# Patient Record
Sex: Male | Born: 1965
Health system: Southern US, Community
[De-identification: ages and names within clinical notes are randomized; demographics above are authoritative.]

## PROBLEM LIST (undated history)

## (undated) DIAGNOSIS — E785 Hyperlipidemia, unspecified: Secondary | ICD-10-CM

## (undated) DIAGNOSIS — I10 Essential (primary) hypertension: Secondary | ICD-10-CM

## (undated) DIAGNOSIS — M47812 Spondylosis without myelopathy or radiculopathy, cervical region: Secondary | ICD-10-CM

## (undated) HISTORY — PX: COLONOSCOPY: SHX174

## (undated) HISTORY — PX: KNEE ARTHROSCOPY: SUR90

---

## 1998-12-17 ENCOUNTER — Encounter: Payer: Self-pay | Admitting: Emergency Medicine

## 1998-12-17 ENCOUNTER — Emergency Department (HOSPITAL_COMMUNITY): Admission: EM | Admit: 1998-12-17 | Discharge: 1998-12-17 | Payer: Self-pay | Admitting: Emergency Medicine

## 2001-11-28 ENCOUNTER — Emergency Department (HOSPITAL_COMMUNITY): Admission: EM | Admit: 2001-11-28 | Discharge: 2001-11-28 | Payer: Self-pay | Admitting: Emergency Medicine

## 2002-06-02 ENCOUNTER — Encounter: Payer: Self-pay | Admitting: Family Medicine

## 2002-06-02 ENCOUNTER — Ambulatory Visit (HOSPITAL_COMMUNITY): Admission: RE | Admit: 2002-06-02 | Discharge: 2002-06-02 | Payer: Self-pay | Admitting: Family Medicine

## 2002-06-12 ENCOUNTER — Emergency Department (HOSPITAL_COMMUNITY): Admission: EM | Admit: 2002-06-12 | Discharge: 2002-06-12 | Payer: Self-pay | Admitting: Emergency Medicine

## 2002-06-12 ENCOUNTER — Encounter: Payer: Self-pay | Admitting: Emergency Medicine

## 2002-06-13 ENCOUNTER — Encounter: Admission: RE | Admit: 2002-06-13 | Discharge: 2002-07-17 | Payer: Self-pay | Admitting: Family Medicine

## 2002-08-24 ENCOUNTER — Encounter: Payer: Self-pay | Admitting: Emergency Medicine

## 2002-08-25 ENCOUNTER — Inpatient Hospital Stay (HOSPITAL_COMMUNITY): Admission: EM | Admit: 2002-08-25 | Discharge: 2002-08-26 | Payer: Self-pay | Admitting: Emergency Medicine

## 2002-08-25 ENCOUNTER — Encounter: Payer: Self-pay | Admitting: Family Medicine

## 2002-08-25 ENCOUNTER — Encounter: Payer: Self-pay | Admitting: Cardiology

## 2002-08-26 ENCOUNTER — Encounter: Payer: Self-pay | Admitting: Family Medicine

## 2002-08-28 ENCOUNTER — Encounter: Admission: RE | Admit: 2002-08-28 | Discharge: 2002-08-28 | Payer: Self-pay | Admitting: Family Medicine

## 2003-04-30 ENCOUNTER — Ambulatory Visit (HOSPITAL_COMMUNITY): Admission: RE | Admit: 2003-04-30 | Discharge: 2003-04-30 | Payer: Self-pay | Admitting: Family Medicine

## 2003-06-11 ENCOUNTER — Emergency Department (HOSPITAL_COMMUNITY): Admission: EM | Admit: 2003-06-11 | Discharge: 2003-06-11 | Payer: Self-pay | Admitting: Emergency Medicine

## 2004-01-21 ENCOUNTER — Ambulatory Visit: Payer: Self-pay | Admitting: Family Medicine

## 2004-03-20 ENCOUNTER — Ambulatory Visit: Payer: Self-pay | Admitting: Family Medicine

## 2004-03-27 ENCOUNTER — Ambulatory Visit (HOSPITAL_COMMUNITY): Admission: RE | Admit: 2004-03-27 | Discharge: 2004-03-27 | Payer: Self-pay | Admitting: Family Medicine

## 2004-04-07 ENCOUNTER — Ambulatory Visit: Payer: Self-pay | Admitting: Family Medicine

## 2004-04-11 ENCOUNTER — Ambulatory Visit: Payer: Self-pay | Admitting: Family Medicine

## 2004-04-11 ENCOUNTER — Ambulatory Visit: Payer: Self-pay | Admitting: *Deleted

## 2004-04-21 ENCOUNTER — Ambulatory Visit: Payer: Self-pay | Admitting: Family Medicine

## 2004-06-06 ENCOUNTER — Ambulatory Visit: Payer: Self-pay | Admitting: Family Medicine

## 2004-08-08 ENCOUNTER — Ambulatory Visit: Payer: Self-pay | Admitting: Family Medicine

## 2004-10-28 ENCOUNTER — Ambulatory Visit: Payer: Self-pay | Admitting: Family Medicine

## 2004-10-31 ENCOUNTER — Ambulatory Visit: Payer: Self-pay | Admitting: Family Medicine

## 2004-11-04 ENCOUNTER — Ambulatory Visit: Payer: Self-pay | Admitting: Family Medicine

## 2004-12-22 ENCOUNTER — Ambulatory Visit: Payer: Self-pay | Admitting: Family Medicine

## 2005-01-06 ENCOUNTER — Ambulatory Visit: Payer: Self-pay | Admitting: Family Medicine

## 2005-01-26 ENCOUNTER — Ambulatory Visit: Payer: Self-pay | Admitting: Family Medicine

## 2005-03-10 ENCOUNTER — Ambulatory Visit: Payer: Self-pay | Admitting: Family Medicine

## 2005-03-10 ENCOUNTER — Ambulatory Visit (HOSPITAL_COMMUNITY): Admission: RE | Admit: 2005-03-10 | Discharge: 2005-03-10 | Payer: Self-pay | Admitting: Family Medicine

## 2005-03-27 ENCOUNTER — Ambulatory Visit: Payer: Self-pay | Admitting: Nurse Practitioner

## 2005-04-06 ENCOUNTER — Ambulatory Visit: Payer: Self-pay | Admitting: Family Medicine

## 2005-05-12 ENCOUNTER — Ambulatory Visit: Payer: Self-pay | Admitting: Family Medicine

## 2005-05-19 ENCOUNTER — Ambulatory Visit: Payer: Self-pay | Admitting: Family Medicine

## 2005-06-01 ENCOUNTER — Ambulatory Visit: Payer: Self-pay | Admitting: Family Medicine

## 2005-06-10 ENCOUNTER — Ambulatory Visit: Payer: Self-pay | Admitting: Family Medicine

## 2005-07-03 ENCOUNTER — Ambulatory Visit: Payer: Self-pay | Admitting: Family Medicine

## 2005-07-06 ENCOUNTER — Ambulatory Visit (HOSPITAL_COMMUNITY): Admission: RE | Admit: 2005-07-06 | Discharge: 2005-07-06 | Payer: Self-pay | Admitting: Family Medicine

## 2005-07-10 ENCOUNTER — Ambulatory Visit: Payer: Self-pay | Admitting: Family Medicine

## 2005-08-06 ENCOUNTER — Ambulatory Visit: Payer: Self-pay | Admitting: Family Medicine

## 2005-10-28 ENCOUNTER — Ambulatory Visit: Payer: Self-pay | Admitting: Family Medicine

## 2006-01-18 IMAGING — CR DG CERVICAL SPINE COMPLETE 4+V
6 series · 6 of 6 positions shown · non-contrast
Comparison: none

CLINICAL DATA: Neck and left shoulder pain. 
 LEFT SHOULDER (THREE VIEW)
 There is no evidence of fracture or dislocation.  No other significant bone or soft tissue abnormalities are identified. 
 IMPRESSION
 Normal study. 
 CERVICAL SPINE (FIVE VIEW)
 There is mild narrowing C5-6 interspace with adjacent end-plate spurs.  There is mild reversal of the normal lordosis of the cervical spine.  Negative for fracture or other acute abnormality. 
 Degenerative disk disease C5-6.  
 Mild reversal of the normal lordosis, which may be due to spasm, soft tissue injury or positioning.

[view not recorded (1 of 6)]
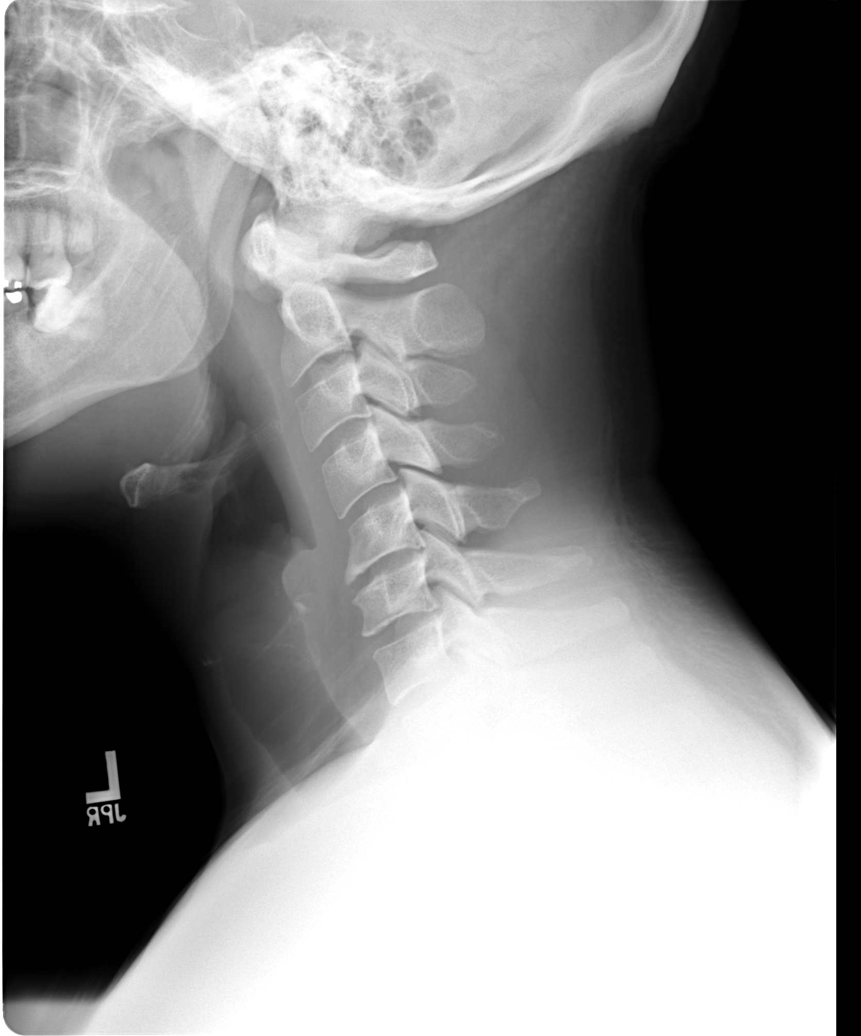

[view not recorded (2 of 6)]
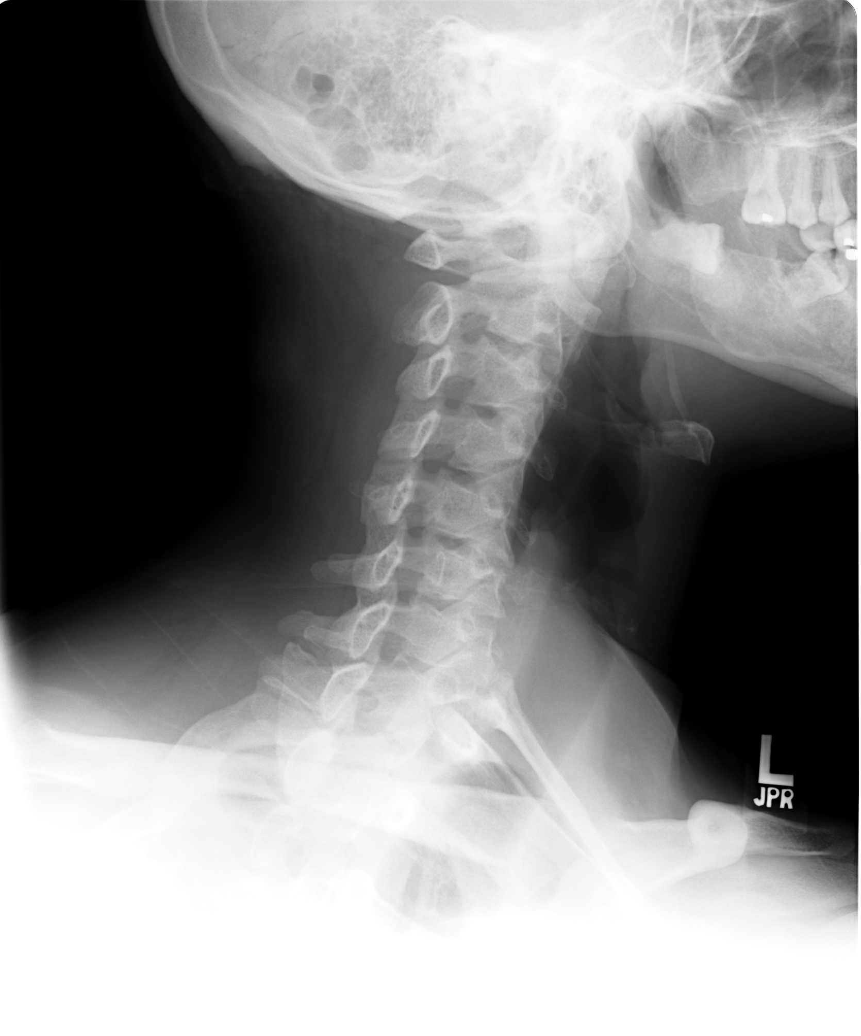

[view not recorded (3 of 6)]
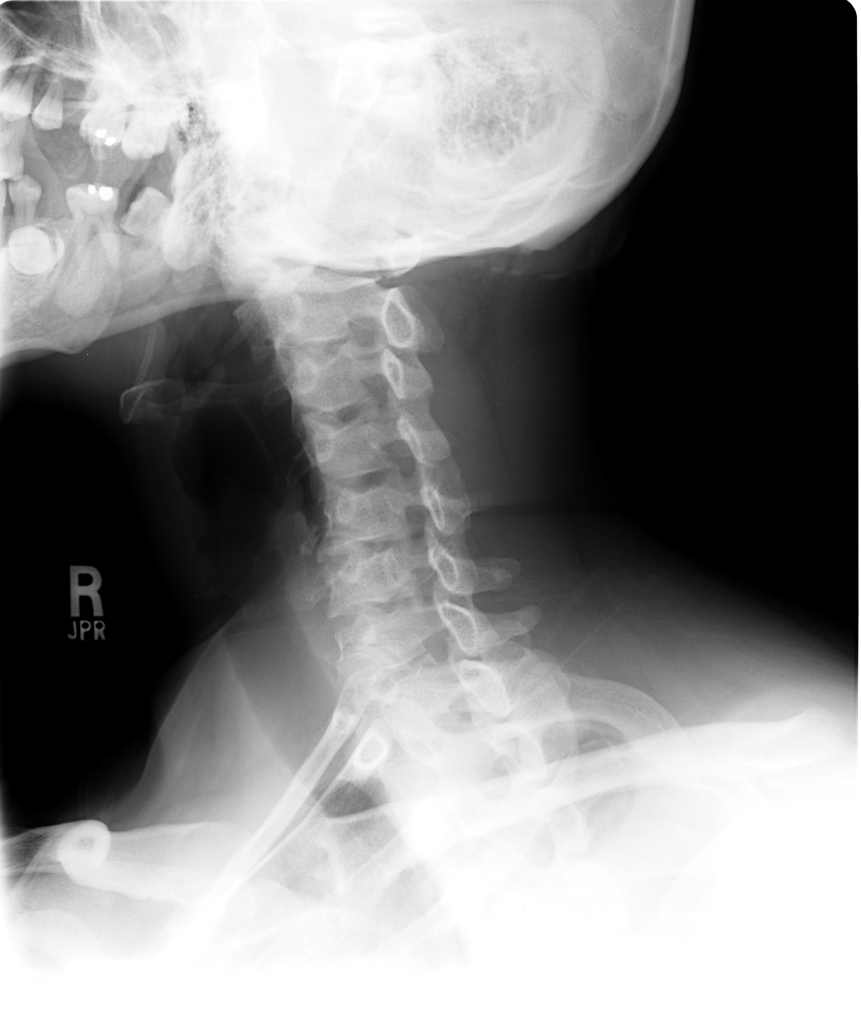

[view not recorded (4 of 6)]
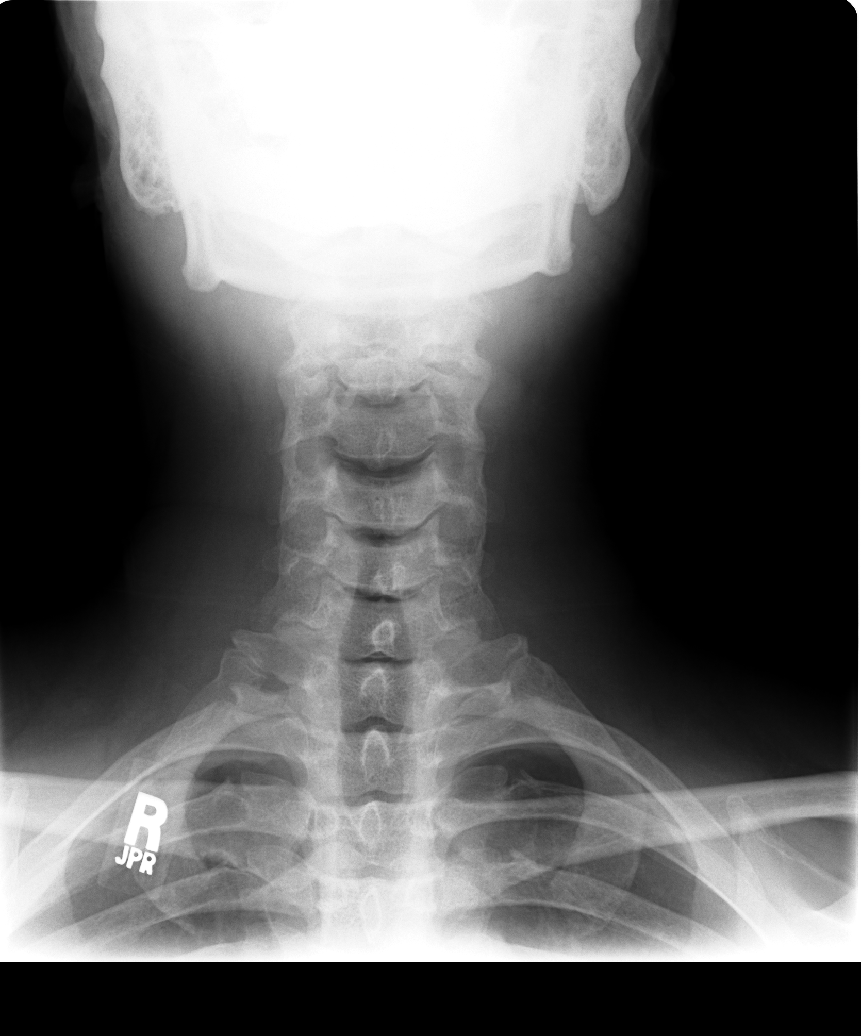

[view not recorded (5 of 6)]
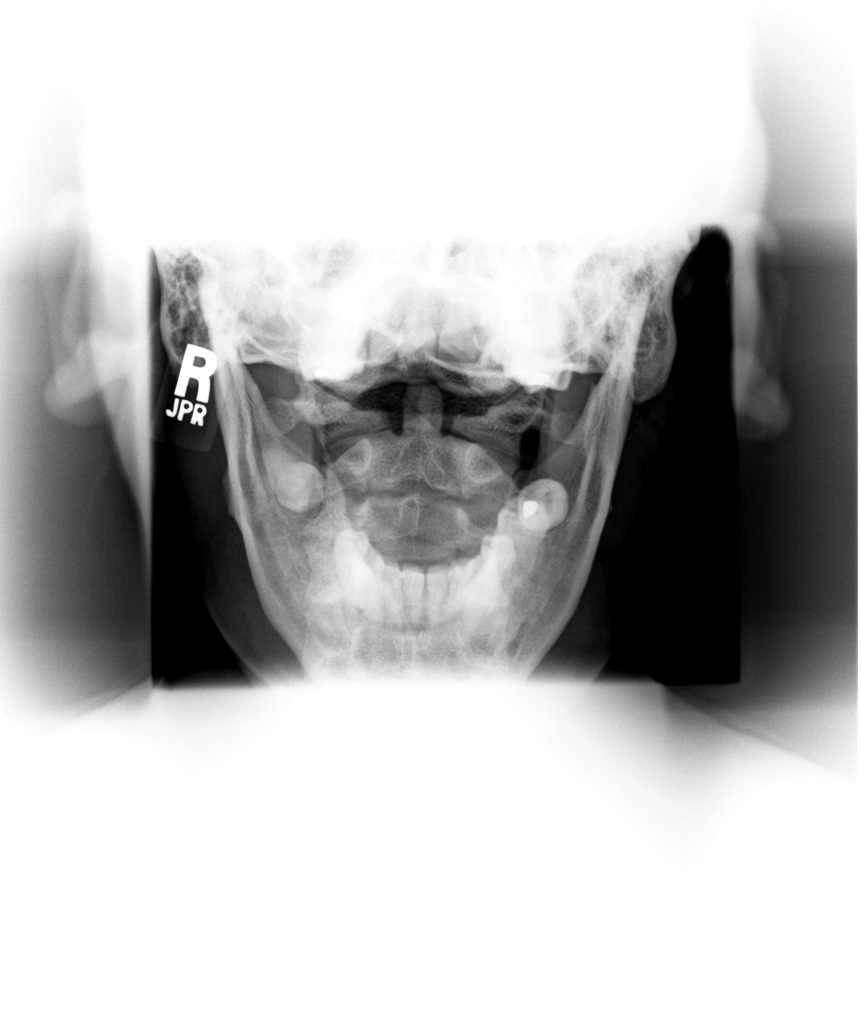

[view not recorded (6 of 6)]
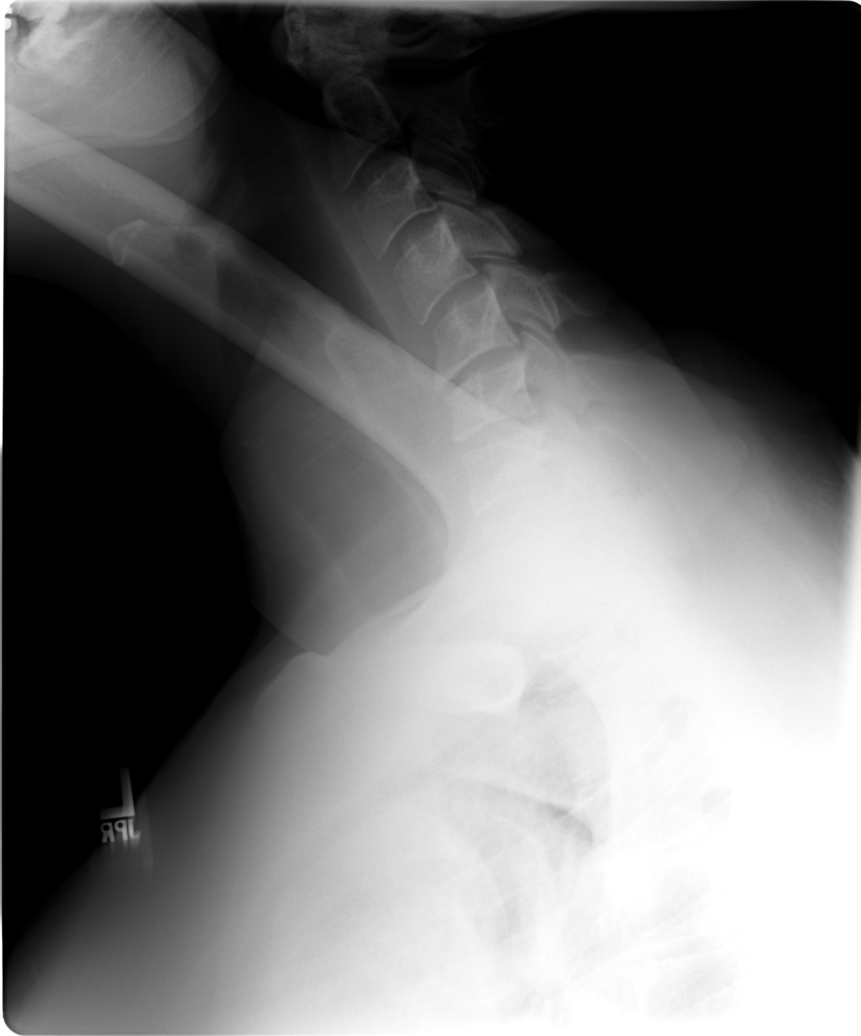

[6 of 6 positions shown; findings below may reference images not displayed]

## 2006-06-10 ENCOUNTER — Ambulatory Visit: Payer: Self-pay | Admitting: Family Medicine

## 2006-08-13 DIAGNOSIS — I10 Essential (primary) hypertension: Secondary | ICD-10-CM

## 2006-08-13 DIAGNOSIS — M5030 Other cervical disc degeneration, unspecified cervical region: Secondary | ICD-10-CM | POA: Insufficient documentation

## 2006-08-13 DIAGNOSIS — E785 Hyperlipidemia, unspecified: Secondary | ICD-10-CM

## 2006-08-13 DIAGNOSIS — K219 Gastro-esophageal reflux disease without esophagitis: Secondary | ICD-10-CM

## 2006-11-24 ENCOUNTER — Encounter (INDEPENDENT_AMBULATORY_CARE_PROVIDER_SITE_OTHER): Payer: Self-pay | Admitting: *Deleted

## 2007-03-21 ENCOUNTER — Ambulatory Visit: Payer: Self-pay | Admitting: Family Medicine

## 2007-04-05 ENCOUNTER — Ambulatory Visit: Payer: Self-pay | Admitting: Family Medicine

## 2007-04-05 LAB — CONVERTED CEMR LAB
ALT: 62 units/L — ABNORMAL HIGH (ref 0–53)
AST: 40 units/L — ABNORMAL HIGH (ref 0–37)
Albumin: 5 g/dL (ref 3.5–5.2)
Alkaline Phosphatase: 81 units/L (ref 39–117)
BUN: 15 mg/dL (ref 6–23)
CO2: 22 meq/L (ref 19–32)
Calcium: 10.3 mg/dL (ref 8.4–10.5)
Chloride: 103 meq/L (ref 96–112)
Cholesterol: 217 mg/dL — ABNORMAL HIGH (ref 0–200)
Creatinine, Ser: 1 mg/dL (ref 0.40–1.50)
Glucose, Bld: 106 mg/dL — ABNORMAL HIGH (ref 70–99)
HDL: 40 mg/dL (ref >39)
LDL Cholesterol: 138 mg/dL — ABNORMAL HIGH (ref 0–99)
Potassium: 3.9 meq/L (ref 3.5–5.3)
Sodium: 142 meq/L (ref 135–145)
Total Bilirubin: 0.4 mg/dL (ref 0.3–1.2)
Total CHOL/HDL Ratio: 5.4
Total Protein: 8.1 g/dL (ref 6.0–8.3)
Triglycerides: 195 mg/dL — ABNORMAL HIGH (ref <150)
VLDL: 39 mg/dL (ref 0–40)

## 2007-09-07 ENCOUNTER — Emergency Department (HOSPITAL_COMMUNITY): Admission: EM | Admit: 2007-09-07 | Discharge: 2007-09-07 | Payer: Self-pay | Admitting: Emergency Medicine

## 2007-10-06 ENCOUNTER — Ambulatory Visit: Payer: Self-pay | Admitting: Family Medicine

## 2007-10-25 ENCOUNTER — Encounter (INDEPENDENT_AMBULATORY_CARE_PROVIDER_SITE_OTHER): Payer: Self-pay | Admitting: Family Medicine

## 2007-10-25 ENCOUNTER — Ambulatory Visit: Payer: Self-pay | Admitting: Internal Medicine

## 2007-10-25 LAB — CONVERTED CEMR LAB
ALT: 45 units/L (ref 0–53)
CO2: 23 meq/L (ref 19–32)
Calcium: 9.4 mg/dL (ref 8.4–10.5)
Chloride: 103 meq/L (ref 96–112)
Cholesterol: 245 mg/dL — ABNORMAL HIGH (ref 0–200)
PSA: 0.83 ng/mL (ref 0.10–4.00)
Sodium: 141 meq/L (ref 135–145)
Total Bilirubin: 0.4 mg/dL (ref 0.3–1.2)
Total Protein: 7.5 g/dL (ref 6.0–8.3)
VLDL: 23 mg/dL (ref 0–40)

## 2007-11-03 ENCOUNTER — Ambulatory Visit: Payer: Self-pay | Admitting: Family Medicine

## 2007-11-21 ENCOUNTER — Ambulatory Visit: Payer: Self-pay | Admitting: Internal Medicine

## 2008-03-16 ENCOUNTER — Ambulatory Visit: Payer: Self-pay | Admitting: Family Medicine

## 2008-05-18 ENCOUNTER — Ambulatory Visit: Payer: Self-pay | Admitting: Family Medicine

## 2008-05-18 LAB — CONVERTED CEMR LAB
ALT: 44 units/L (ref 0–53)
AST: 34 units/L (ref 0–37)
Albumin: 4.9 g/dL (ref 3.5–5.2)
Alkaline Phosphatase: 73 units/L (ref 39–117)
Glucose, Bld: 105 mg/dL — ABNORMAL HIGH (ref 70–99)
LDL Cholesterol: 144 mg/dL — ABNORMAL HIGH (ref 0–99)
Potassium: 3.9 meq/L (ref 3.5–5.3)
Sodium: 140 meq/L (ref 135–145)
Total Protein: 7.9 g/dL (ref 6.0–8.3)
Triglycerides: 224 mg/dL — ABNORMAL HIGH (ref <150)

## 2008-05-24 ENCOUNTER — Emergency Department (HOSPITAL_COMMUNITY): Admission: EM | Admit: 2008-05-24 | Discharge: 2008-05-24 | Payer: Self-pay | Admitting: Family Medicine

## 2008-06-06 ENCOUNTER — Ambulatory Visit: Payer: Self-pay | Admitting: Family Medicine

## 2008-06-13 ENCOUNTER — Ambulatory Visit (HOSPITAL_COMMUNITY): Admission: RE | Admit: 2008-06-13 | Discharge: 2008-06-13 | Payer: Self-pay | Admitting: Family Medicine

## 2008-07-05 ENCOUNTER — Ambulatory Visit: Payer: Self-pay | Admitting: Family Medicine

## 2008-07-05 ENCOUNTER — Encounter: Payer: Self-pay | Admitting: Internal Medicine

## 2008-07-05 DIAGNOSIS — R252 Cramp and spasm: Secondary | ICD-10-CM | POA: Insufficient documentation

## 2008-07-09 ENCOUNTER — Emergency Department (HOSPITAL_COMMUNITY): Admission: EM | Admit: 2008-07-09 | Discharge: 2008-07-09 | Payer: Self-pay | Admitting: Emergency Medicine

## 2008-08-07 ENCOUNTER — Ambulatory Visit: Payer: Self-pay | Admitting: Family Medicine

## 2008-11-13 ENCOUNTER — Ambulatory Visit: Payer: Self-pay | Admitting: Family Medicine

## 2008-11-13 LAB — CONVERTED CEMR LAB: Microalb, Ur: 0.8 mg/dL (ref 0.00–1.89)

## 2009-01-12 ENCOUNTER — Emergency Department (HOSPITAL_COMMUNITY): Admission: EM | Admit: 2009-01-12 | Discharge: 2009-01-12 | Payer: Self-pay | Admitting: Family Medicine

## 2009-02-02 ENCOUNTER — Emergency Department (HOSPITAL_BASED_OUTPATIENT_CLINIC_OR_DEPARTMENT_OTHER): Admission: EM | Admit: 2009-02-02 | Discharge: 2009-02-02 | Payer: Self-pay | Admitting: Emergency Medicine

## 2009-03-14 ENCOUNTER — Ambulatory Visit: Payer: Self-pay | Admitting: Family Medicine

## 2009-03-14 LAB — CONVERTED CEMR LAB
Chloride: 106 meq/L (ref 96–112)
Cholesterol: 243 mg/dL — ABNORMAL HIGH (ref 0–200)
HDL: 35 mg/dL — ABNORMAL LOW (ref >39)
LDL Cholesterol: 184 mg/dL — ABNORMAL HIGH (ref 0–99)
Potassium: 3.6 meq/L (ref 3.5–5.3)
Sodium: 143 meq/L (ref 135–145)
Total CHOL/HDL Ratio: 6.9
Triglycerides: 122 mg/dL (ref <150)
VLDL: 24 mg/dL (ref 0–40)

## 2009-04-29 ENCOUNTER — Emergency Department (HOSPITAL_BASED_OUTPATIENT_CLINIC_OR_DEPARTMENT_OTHER): Admission: EM | Admit: 2009-04-29 | Discharge: 2009-04-29 | Payer: Self-pay | Admitting: Emergency Medicine

## 2009-06-18 ENCOUNTER — Ambulatory Visit: Payer: Self-pay | Admitting: Family Medicine

## 2009-06-18 LAB — CONVERTED CEMR LAB
Cholesterol: 254 mg/dL — ABNORMAL HIGH (ref 0–200)
Triglycerides: 105 mg/dL (ref <150)

## 2009-07-05 ENCOUNTER — Ambulatory Visit: Payer: Self-pay | Admitting: Family Medicine

## 2009-10-03 ENCOUNTER — Ambulatory Visit: Payer: Self-pay | Admitting: Family Medicine

## 2009-10-03 LAB — CONVERTED CEMR LAB
Cholesterol: 260 mg/dL — ABNORMAL HIGH (ref 0–200)
Total CHOL/HDL Ratio: 7.2
Triglycerides: 159 mg/dL — ABNORMAL HIGH (ref <150)
VLDL: 32 mg/dL (ref 0–40)

## 2009-11-13 ENCOUNTER — Ambulatory Visit: Payer: Self-pay | Admitting: Diagnostic Radiology

## 2009-11-13 ENCOUNTER — Ambulatory Visit: Payer: Self-pay | Admitting: Cardiology

## 2009-11-13 ENCOUNTER — Encounter: Payer: Self-pay | Admitting: Emergency Medicine

## 2009-11-13 ENCOUNTER — Observation Stay (HOSPITAL_COMMUNITY): Admission: AD | Admit: 2009-11-13 | Discharge: 2009-11-14 | Payer: Self-pay | Admitting: Internal Medicine

## 2009-11-14 ENCOUNTER — Encounter (INDEPENDENT_AMBULATORY_CARE_PROVIDER_SITE_OTHER): Payer: Self-pay | Admitting: Internal Medicine

## 2010-02-06 ENCOUNTER — Encounter (INDEPENDENT_AMBULATORY_CARE_PROVIDER_SITE_OTHER): Payer: Self-pay | Admitting: Family Medicine

## 2010-02-06 LAB — CONVERTED CEMR LAB
ALT: 36 units/L (ref 0–53)
LDL Cholesterol: 185 mg/dL — ABNORMAL HIGH (ref 0–99)

## 2010-03-09 ENCOUNTER — Emergency Department (HOSPITAL_BASED_OUTPATIENT_CLINIC_OR_DEPARTMENT_OTHER)
Admission: EM | Admit: 2010-03-09 | Discharge: 2010-03-09 | Payer: Self-pay | Source: Home / Self Care | Admitting: Emergency Medicine

## 2010-03-19 ENCOUNTER — Ambulatory Visit (HOSPITAL_COMMUNITY)
Admission: RE | Admit: 2010-03-19 | Discharge: 2010-03-19 | Payer: Self-pay | Source: Home / Self Care | Attending: Family Medicine | Admitting: Family Medicine

## 2010-03-30 ENCOUNTER — Encounter: Payer: Self-pay | Admitting: Family Medicine

## 2010-04-03 ENCOUNTER — Ambulatory Visit (HOSPITAL_COMMUNITY)
Admission: RE | Admit: 2010-04-03 | Discharge: 2010-04-03 | Payer: Self-pay | Source: Home / Self Care | Attending: Family Medicine | Admitting: Family Medicine

## 2010-04-10 ENCOUNTER — Encounter (INDEPENDENT_AMBULATORY_CARE_PROVIDER_SITE_OTHER): Payer: Self-pay | Admitting: Family Medicine

## 2010-04-10 LAB — CONVERTED CEMR LAB
Chloride: 107 meq/L (ref 96–112)
HDL: 38 mg/dL — ABNORMAL LOW (ref >39)
LDL Cholesterol: 161 mg/dL — ABNORMAL HIGH (ref 0–99)
Potassium: 3.7 meq/L (ref 3.5–5.3)
Triglycerides: 188 mg/dL — ABNORMAL HIGH (ref <150)
VLDL: 38 mg/dL (ref 0–40)

## 2010-05-22 LAB — POCT CARDIAC MARKERS
CKMB, poc: 2.4 ng/mL (ref 1.0–8.0)
CKMB, poc: 2.7 ng/mL (ref 1.0–8.0)
Troponin i, poc: <0.05 ng/mL (ref 0.00–0.09)
Troponin i, poc: <0.05 ng/mL (ref 0.00–0.09)

## 2010-05-22 LAB — COMPREHENSIVE METABOLIC PANEL
AST: 40 U/L — ABNORMAL HIGH (ref 0–37)
CO2: 26 mEq/L (ref 19–32)
Calcium: 9 mg/dL (ref 8.4–10.5)
Creatinine, Ser: 1.12 mg/dL (ref 0.4–1.5)
GFR calc Af Amer: >60 mL/min (ref >60)
GFR calc non Af Amer: >60 mL/min (ref >60)
Glucose, Bld: 117 mg/dL — ABNORMAL HIGH (ref 70–99)

## 2010-05-22 LAB — CBC
HCT: 41.6 % (ref 39.0–52.0)
Hemoglobin: 13.6 g/dL (ref 13.0–17.0)
MCH: 28.7 pg (ref 26.0–34.0)
MCHC: 34 g/dL (ref 30.0–36.0)
MCV: 85.7 fL (ref 78.0–100.0)
RBC: 4.85 MIL/uL (ref 4.22–5.81)
WBC: 5.2 10*3/uL (ref 4.0–10.5)

## 2010-05-22 LAB — DIFFERENTIAL
Eosinophils Relative: 2 % (ref 0–5)
Lymphocytes Relative: 29 % (ref 12–46)
Lymphs Abs: 1.5 10*3/uL (ref 0.7–4.0)
Monocytes Absolute: 0.3 10*3/uL (ref 0.1–1.0)

## 2010-05-22 LAB — BASIC METABOLIC PANEL
Chloride: 107 mEq/L (ref 96–112)
GFR calc Af Amer: >60 mL/min (ref >60)
Potassium: 4.5 mEq/L (ref 3.5–5.1)
Sodium: 143 mEq/L (ref 135–145)

## 2010-05-22 LAB — CARDIAC PANEL(CRET KIN+CKTOT+MB+TROPI)
CK, MB: 5.5 ng/mL — ABNORMAL HIGH (ref 0.3–4.0)
CK, MB: 5.9 ng/mL — ABNORMAL HIGH (ref 0.3–4.0)

## 2010-06-17 LAB — COMPREHENSIVE METABOLIC PANEL
AST: 48 U/L — ABNORMAL HIGH (ref 0–37)
Albumin: 4.9 g/dL (ref 3.5–5.2)
BUN: 8 mg/dL (ref 6–23)
Calcium: 9.8 mg/dL (ref 8.4–10.5)
Chloride: 105 mEq/L (ref 96–112)
Creatinine, Ser: 1.14 mg/dL (ref 0.4–1.5)
GFR calc Af Amer: >60 mL/min (ref >60)
Total Bilirubin: 0.7 mg/dL (ref 0.3–1.2)
Total Protein: 7.6 g/dL (ref 6.0–8.3)

## 2010-06-17 LAB — DIFFERENTIAL
Basophils Absolute: 0 10*3/uL (ref 0.0–0.1)
Basophils Relative: 0 % (ref 0–1)
Eosinophils Relative: 1 % (ref 0–5)
Lymphocytes Relative: 19 % (ref 12–46)

## 2010-06-17 LAB — CBC
HCT: 42 % (ref 39.0–52.0)
MCHC: 34.4 g/dL (ref 30.0–36.0)
Platelets: 167 10*3/uL (ref 150–400)
RDW: 13.4 % (ref 11.5–15.5)

## 2010-06-17 LAB — TROPONIN I: Troponin I: 0.02 ng/mL (ref 0.00–0.06)

## 2010-06-17 LAB — POCT CARDIAC MARKERS
CKMB, poc: 3.6 ng/mL (ref 1.0–8.0)
Myoglobin, poc: 316 ng/mL (ref 12–200)
Troponin i, poc: <0.05 ng/mL (ref 0.00–0.09)

## 2010-07-25 NOTE — Consult Note (Signed)
NAME:  Jonathan, [name]                       ACCOUNT NO.:  [id number]   MEDICAL RECORD NO.:  [id number]                   PATIENT TYPE:  EMS   LOCATION:  ED                                   FACILITY:  [facility]   PHYSICIAN:  Olga Millers, M.D. [facility]            DATE OF BIRTH:  1966/01/16   DATE OF CONSULTATION:  06/11/2003  DATE OF DISCHARGE:                                   CONSULTATION   REASON FOR CONSULTATION:  [name] is a 45 year old male with a past  medical history of hypertension and hyperlipidemia who presents with chest  pain.  The patient has no prior cardiac history other than an admission in  June 2004, to [facility] for chest pain.  At that time, he  apparently ruled out by troponins, but his CKs were elevated.  He did have a  Cardiolite that showed an ejection fraction of 48%, and normal perfusion.  An echocardiogram showed normal LV function and no significant valvular  abnormalities.  The patient typically does not have exertional chest pain,  dyspnea on exertion, syncope, or palpitations.  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  Over the  past 1-1/2 to 2 weeks he complains to have chest pain.  It is described as a  pressure under the left breast and radiating to the left back.  It is not  exertional nor it is pleuritic.  It is not related to food.  It is not  positional.  There is no associated nausea, vomiting, shortness of breath,  diaphoresis.  The pain has been continuous for 1-1/2 to 2 weeks without ever  completely resolving.  There was some improvement in the back pain with his  wife massaging the area.  Because of his persistent symptoms he presented to  the emergency room for further evaluation, and we were asked to further  evaluate.   ALLERGIES:  No known drug allergies.   MEDICATIONS:  On no medications on a  routine basis.   SOCIAL HISTORY:  He denies any tobacco use or alcohol use.  He does  occasionally use marijuana.   FAMILY HISTORY:  Positive for coronary artery disease in his father.   PAST MEDICAL HISTORY:  1. Hypertension.  2. Hyperlipidemia.  No diabetes mellitus.   PAST SURGICAL HISTORY:  Arthroscopic surgery on his right knee, but no other  surgeries are noted.   REVIEW OF SYSTEMS:  He does occasionally have a headache.  There is no  fevers, chills, productive cough.  There is no hemoptysis.  There is no  dysphagia, odynophagia, melena, hematochezia.  There is no dysuria or  hematuria.  There is no rash or seizure activity.  There is no orthopnea,  PND, or pedal edema.  He has been on no  recent trips.  The remaining systems  are negative.   PHYSICAL EXAMINATION:  VITAL SIGNS:  His blood pressure is 148/87, his pulse  is 62.  He is afebrile.  He is 100% on room air.  GENERAL:  He is well-developed, well-nourished, in no acute distress.  He  does not appear to be depressed.  There is no peripheral clubbing.  SKIN:  Warm and dry.  HEENT:  Unremarkable with normal eyelids.  NECK:  Supple with normal upstrokes bilaterally and no bruits.  There is no  jugular venous distention and no thyromegaly noted.  CHEST:  Clear to auscultation and percussion.  CARDIOVASCULAR:  Regular rate and rhythm, normal S1 and S2.  There are no  murmurs, rubs, or gallops noted.  He is not tender over the left chest area.  ABDOMEN:  Nontender, nondistended, positive bowel sounds, no  hepatosplenomegaly, no masses appreciated.  There is no abdominal bruit.  He  has 2+ femoral pulses bilaterally.  EXTREMITIES:  No edema and no cords are palpated.  He has 2+ posterior  tibial pulses bilaterally.  NEUROLOGIC:  Grossly intact.   LABORATORY DATA:  His electrocardiogram shows normal sinus rhythm with  lateral T-wave inversion.  It is unchanged from June 2004.  His chest x-ray  shows no active disease.   His CK is 565 with a MB of 5.61.  His troponin-I  is less than 0.01.   DIAGNOSES:  1. Atypical chest pain.  2. History of hypertension, on no therapy.  3. Hyperlipidemia.  4. Marijuana use.   PLAN:  [name] presents with chest pain that is atypical and most  likely musculoskeletal in etiology.  It has been continuous for the past 1-  1/2 weeks to 2 weeks without ever completely resolving and it is improved  with his wife's massage.  His troponin is negative, and his  electrocardiogram is unchanged.  I think it would be appropriate to  discharge the patient with a course of Motrin.  We will schedule an  outpatient Cardiolite for risk stratification.  He will follow up with Dr.  Audria Nine for his blood pressure and cholesterol.                                               Olga Millers, M.D. [facility]    BC/MEDQ  D:  06/11/2003  T:  06/12/2003  Job:  [id number]

## 2010-07-25 NOTE — Discharge Summary (Signed)
[name], [name]                       ACCOUNT NO.:  [id number]   MEDICAL RECORD NO.:  [id number]                   PATIENT TYPE:  INP   LOCATION:  2039                                 FACILITY:  [facility]   PHYSICIAN:  [name], M.D.           DATE OF BIRTH:  Oct 12, 1965   DATE OF ADMISSION:  08/24/2002  DATE OF DISCHARGE:  08/26/2002                                 DISCHARGE SUMMARY   PRIMARY CARE PHYSICIAN:  [facility].   CONSULTS:  [facility], Dr. Vernie [name]. [name].   PROCEDURES:  1. 2D echocardiogram August 25, 2002.  2. Cardiolite August 26, 2002.   DISCHARGE DIAGNOSES:  1. Atypical the patient, likely musculoskeletal in etiology.  2. Hypertension.  3. Hyperlipidemia.   DISCHARGE MEDICATIONS:  1. Aspirin  81 mg p.o. every day.  2. Pravachol 40 mg p.o. every day.  3. HCTZ 12.5 mg p.o. every day.   DISCHARGE INSTRUCTIONS:  The patient was  instructed to eat a low fat, low  salt diet.   FOLLOW UP:  He is to follow up at [facility] within 2 to 4 weeks after  hospitalization. He also was to follow up with Dr. Andee [name] at [facility]. Their office will call him for an appointment.   HISTORY OF PRESENT ILLNESS:  The patient is a 45 year old African American  male who presented with atypical chest pain. He noticed that the chest pain  after finishing a workout in martial arts and described it as left sided,  nonradiating, described as a pressure sensation associated with diaphoresis,  pain relieved with nitroglycerin in the emergency room. He was  initially  placed on a nitroglycerin drip in the emergency room and then switched to  sublingual nitroglycerin.   HOSPITAL COURSE:  PROBLEM #1, CHEST PAIN, ATYPICAL IN NATURE:  Three sets of  cardiac enzymes showed normal relative index and troponins but with elevated  CK and CK-MB which did trend down. An EKG on admission showed normal sinus  rhythm and J-point elevation in V2 and V4 with questionable  T-wave  inversions in leads 1, 2 and V4 through V6. [facility] was  consulted. A 2D echocardiogram had been ordered by the patient's outpatient  doctor but had not yet been done, so this was done in house, and showed  normal left ventricular systolic function with an ejection fraction of 55%  to 65% and was otherwise normal.   [facility] performed a Cardiolite on August 26, 2002, which showed an  ejection fraction of 48% and no scar or ischemia. The patient is to follow  up with cardiology as above.   PROBLEM #2, HYPERCHOLESTEREMIA:  A fasting lipid panel done during  hospitalization  showed total cholesterol 250, LDL 181, HDL 38 and  triglycerides 160, and this was on Zocor 20 mg p.o. every day. We feel that  it is likely  that the patient's chest pain was musculoskeletal in origin  and the patient's Zocor may have contributed to Myalgias, so he will be  switched to Pravachol 40 mg daily to see if this will improve his symptoms.   PROBLEM #3, HYPERTENSION:  The patient was  initially started on Lopressor  in the setting of chest pain, which did control his blood pressure well.  However, he is an avid Occupational hygienist and would like to avoid any medicine  that could cause exercise intolerance, so he will be discharged on HCTZ 12.5  mg p.o. every day. Electrolytes were normal during hospitalization  but  should be checked as an outpatient once he starts his medicine.                                               [name], M.D.    NCR/MEDQ  D:  08/26/2002  T:  08/28/2002  Job:  [id number]   cc:   [name], M.D.  [address]  [address]  [place]  [place] [address]   [facility]    cc:   [name], M.D.  [address]  [address]  [place]  [place] [address]   [facility]
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
## 2010-12-04 LAB — POCT URINALYSIS DIP (DEVICE)
Bilirubin Urine: NEGATIVE
Glucose, UA: NEGATIVE
Hgb urine dipstick: NEGATIVE
Nitrite: NEGATIVE
Operator id: 247071
Specific Gravity, Urine: 1.025
Urobilinogen, UA: 1

## 2010-12-04 LAB — CBC
Platelets: 183
RDW: 13.5
WBC: 6.4

## 2010-12-04 LAB — POCT I-STAT, CHEM 8
BUN: 15
Creatinine, Ser: 1.1
Hemoglobin: 16
Potassium: 3.7
Sodium: 139
TCO2: 28

## 2010-12-04 LAB — DIFFERENTIAL
Basophils Absolute: 0
Eosinophils Relative: 2
Lymphocytes Relative: 30
Lymphs Abs: 1.9
Neutro Abs: 3.9
Neutrophils Relative %: 61

## 2011-03-09 ENCOUNTER — Encounter: Payer: Self-pay | Admitting: *Deleted

## 2011-03-09 ENCOUNTER — Emergency Department (HOSPITAL_BASED_OUTPATIENT_CLINIC_OR_DEPARTMENT_OTHER)
Admission: EM | Admit: 2011-03-09 | Discharge: 2011-03-09 | Disposition: A | Discharge status: Home / Self Care | Payer: Self-pay | Attending: Emergency Medicine | Admitting: Emergency Medicine

## 2011-03-09 DIAGNOSIS — R590 Localized enlarged lymph nodes: Secondary | ICD-10-CM

## 2011-03-09 DIAGNOSIS — I10 Essential (primary) hypertension: Secondary | ICD-10-CM | POA: Insufficient documentation

## 2011-03-09 DIAGNOSIS — L03221 Cellulitis of neck: Secondary | ICD-10-CM | POA: Insufficient documentation

## 2011-03-09 DIAGNOSIS — L0211 Cutaneous abscess of neck: Secondary | ICD-10-CM | POA: Insufficient documentation

## 2011-03-09 DIAGNOSIS — E785 Hyperlipidemia, unspecified: Secondary | ICD-10-CM | POA: Insufficient documentation

## 2011-03-09 DIAGNOSIS — R599 Enlarged lymph nodes, unspecified: Secondary | ICD-10-CM | POA: Insufficient documentation

## 2011-03-09 HISTORY — DX: Spondylosis without myelopathy or radiculopathy, cervical region: M47.812

## 2011-03-09 HISTORY — DX: Essential (primary) hypertension: I10

## 2011-03-09 HISTORY — DX: Hyperlipidemia, unspecified: E78.5

## 2011-03-09 NOTE — ED Provider Notes (Signed)
History     CSN: [id number]  Arrival date & time 03/09/11  1019   First MD Initiated Contact with Patient 03/09/11 1043      Chief Complaint  Patient presents with  . Abscess    knot on posterior scalp    (Consider location/radiation/quality/duration/timing/severity/associated sxs/prior treatment) HPI Comments: Had "crick" in neck one week ago, now noticed two small bumps in the back of his neck.  They are tender to the touch.  Denies cough, fever, recent illness.  Patient is a 45 y.o. male presenting with abscess. The history is provided by the patient.  Abscess  This is a new problem. The current episode started more than one week ago. The problem occurs continuously. The problem has been gradually improving. The abscess is present on the neck. The problem is mild.    Past Medical History  Diagnosis Date  . Hypertension   . Hyperlipidemia   . Cervical spine degeneration     5-6 narrowing of cervical spine    Past Surgical History  Procedure Date  . Knee arthroscopy     right    No family history on file.  History  Substance Use Topics  . Smoking status: Never Smoker   . Smokeless tobacco: Not on file  . Alcohol Use: No      Review of Systems  All other systems reviewed and are negative.    Allergies  Niacin  Home Medications   Current Outpatient Rx  Name Route Sig Dispense Refill  . AMLODIPINE BESYLATE 5 MG PO TABS Oral Take 5 mg by mouth daily.      Marland Kitchen HYDROCHLOROTHIAZIDE 25 MG PO TABS Oral Take 25 mg by mouth daily.      Marland Kitchen ROSUVASTATIN CALCIUM 10 MG PO TABS Oral Take 20 mg by mouth daily.        BP 158/92  Pulse 71  Temp(Src) 97.9 F (36.6 C) (Oral)  Resp 20  Ht 5\' 8"  (1.727 m)  Wt 210 lb (95.255 kg)  BMI 31.93 kg/m2  SpO2 98%  Physical Exam  Nursing note and vitals reviewed. Constitutional: He is oriented to person, place, and time. He appears well-developed and well-nourished.  HENT:  Head: Normocephalic and atraumatic.  Neck:  Normal range of motion. Neck supple.       There are two small, pea-sized lymph nodes in the posterior neck/occipital area.  They are tender to the touch.    Musculoskeletal: Normal range of motion.  Neurological: He is alert and oriented to person, place, and time.  Skin: Skin is warm and dry.    ED Course  Procedures (including critical care time)  Labs Reviewed - No data to display No results found.   No diagnosis found.    MDM  Cervical/occipital adenopathy.  Follow up if not resolved in two weeks.        Geoffery Lyons, MD 03/09/11 [id number]

## 2011-03-09 NOTE — ED Notes (Signed)
Patient states he woke up with a crick in his neck 5 - 6 days ago.  States after he was able to move his head he felt a knot on the posterior scalp and sensitivity to the top of his scalp.  States the size of the knot has decreased now.

## 2011-05-20 ENCOUNTER — Ambulatory Visit: Payer: Self-pay | Attending: Family Medicine | Admitting: Physical Therapy

## 2011-05-20 DIAGNOSIS — M542 Cervicalgia: Secondary | ICD-10-CM | POA: Insufficient documentation

## 2011-05-20 DIAGNOSIS — IMO0001 Reserved for inherently not codable concepts without codable children: Secondary | ICD-10-CM | POA: Insufficient documentation

## 2011-05-20 DIAGNOSIS — M546 Pain in thoracic spine: Secondary | ICD-10-CM | POA: Insufficient documentation

## 2011-05-20 DIAGNOSIS — M25519 Pain in unspecified shoulder: Secondary | ICD-10-CM | POA: Insufficient documentation

## 2011-05-20 DIAGNOSIS — M2560 Stiffness of unspecified joint, not elsewhere classified: Secondary | ICD-10-CM | POA: Insufficient documentation

## 2011-05-25 ENCOUNTER — Ambulatory Visit: Payer: Self-pay | Admitting: Physical Therapy

## 2011-05-27 ENCOUNTER — Ambulatory Visit: Payer: Self-pay | Admitting: Physical Therapy

## 2011-06-01 ENCOUNTER — Ambulatory Visit: Payer: Self-pay | Admitting: Physical Therapy

## 2011-06-03 ENCOUNTER — Ambulatory Visit: Payer: Self-pay | Admitting: Physical Therapy

## 2011-06-09 ENCOUNTER — Ambulatory Visit: Payer: Self-pay | Attending: Family Medicine | Admitting: Physical Therapy

## 2011-06-09 DIAGNOSIS — IMO0001 Reserved for inherently not codable concepts without codable children: Secondary | ICD-10-CM | POA: Insufficient documentation

## 2011-06-09 DIAGNOSIS — M2560 Stiffness of unspecified joint, not elsewhere classified: Secondary | ICD-10-CM | POA: Insufficient documentation

## 2011-06-09 DIAGNOSIS — M542 Cervicalgia: Secondary | ICD-10-CM | POA: Insufficient documentation

## 2011-06-09 DIAGNOSIS — M25519 Pain in unspecified shoulder: Secondary | ICD-10-CM | POA: Insufficient documentation

## 2011-06-09 DIAGNOSIS — M546 Pain in thoracic spine: Secondary | ICD-10-CM | POA: Insufficient documentation

## 2011-06-11 ENCOUNTER — Ambulatory Visit: Payer: Self-pay | Admitting: Physical Therapy

## 2011-06-15 ENCOUNTER — Ambulatory Visit: Payer: Self-pay | Admitting: Physical Therapy

## 2011-06-17 ENCOUNTER — Encounter: Payer: Self-pay | Admitting: Physical Therapy

## 2011-06-18 ENCOUNTER — Encounter: Payer: Self-pay | Admitting: Physical Therapy

## 2011-06-22 ENCOUNTER — Ambulatory Visit: Payer: Self-pay | Admitting: Physical Therapy

## 2012-10-17 DIAGNOSIS — M542 Cervicalgia: Secondary | ICD-10-CM | POA: Insufficient documentation

## 2013-04-10 DIAGNOSIS — E876 Hypokalemia: Secondary | ICD-10-CM | POA: Insufficient documentation

## 2013-04-10 DIAGNOSIS — M25561 Pain in right knee: Secondary | ICD-10-CM | POA: Insufficient documentation

## 2013-11-24 DIAGNOSIS — M7712 Lateral epicondylitis, left elbow: Secondary | ICD-10-CM | POA: Insufficient documentation

## 2013-11-24 DIAGNOSIS — M25512 Pain in left shoulder: Secondary | ICD-10-CM | POA: Insufficient documentation

## 2014-06-23 ENCOUNTER — Emergency Department (HOSPITAL_BASED_OUTPATIENT_CLINIC_OR_DEPARTMENT_OTHER): Payer: 59

## 2014-06-23 ENCOUNTER — Encounter (HOSPITAL_BASED_OUTPATIENT_CLINIC_OR_DEPARTMENT_OTHER): Payer: Self-pay | Admitting: *Deleted

## 2014-06-23 ENCOUNTER — Emergency Department (HOSPITAL_BASED_OUTPATIENT_CLINIC_OR_DEPARTMENT_OTHER)
Admission: EM | Admit: 2014-06-23 | Discharge: 2014-06-23 | Disposition: A | Discharge status: Home / Self Care | Payer: 59 | Attending: Emergency Medicine | Admitting: Emergency Medicine

## 2014-06-23 DIAGNOSIS — Z79899 Other long term (current) drug therapy: Secondary | ICD-10-CM | POA: Diagnosis not present

## 2014-06-23 DIAGNOSIS — I10 Essential (primary) hypertension: Secondary | ICD-10-CM | POA: Diagnosis not present

## 2014-06-23 DIAGNOSIS — E785 Hyperlipidemia, unspecified: Secondary | ICD-10-CM | POA: Diagnosis not present

## 2014-06-23 DIAGNOSIS — Z8739 Personal history of other diseases of the musculoskeletal system and connective tissue: Secondary | ICD-10-CM | POA: Diagnosis not present

## 2014-06-23 DIAGNOSIS — G4489 Other headache syndrome: Secondary | ICD-10-CM | POA: Diagnosis not present

## 2014-06-23 DIAGNOSIS — R51 Headache: Secondary | ICD-10-CM | POA: Diagnosis present

## 2014-06-23 MED ORDER — DIPHENHYDRAMINE HCL 50 MG/ML IJ SOLN
25.0000 mg | Freq: Once | INTRAMUSCULAR | Status: AC
  Administered 2014-06-23: 25 mg via INTRAVENOUS
  Filled 2014-06-23: qty 1

## 2014-06-23 MED ORDER — METOCLOPRAMIDE HCL 5 MG/ML IJ SOLN
10.0000 mg | Freq: Once | INTRAMUSCULAR | Status: AC
  Administered 2014-06-23: 10 mg via INTRAVENOUS
  Filled 2014-06-23: qty 2

## 2014-06-23 MED ORDER — KETOROLAC TROMETHAMINE 30 MG/ML IJ SOLN
30.0000 mg | Freq: Once | INTRAMUSCULAR | Status: AC
  Administered 2014-06-23: 30 mg via INTRAVENOUS

## 2014-06-23 MED ORDER — SODIUM CHLORIDE 0.9 % IV BOLUS (SEPSIS)
1000.0000 mL | Freq: Once | INTRAVENOUS | Status: AC
  Administered 2014-06-23: 1000 mL via INTRAVENOUS

## 2014-06-23 MED ORDER — KETOROLAC TROMETHAMINE 30 MG/ML IJ SOLN
INTRAMUSCULAR | Status: AC
  Administered 2014-06-23: 30 mg via INTRAVENOUS
  Filled 2014-06-23: qty 1

## 2014-06-23 NOTE — ED Notes (Signed)
Intermittent posterior headache x 1 week with some dizziness and nausea- Pt reports hx of C5-C6 narrowing

## 2014-06-23 NOTE — Discharge Instructions (Signed)
Refer to attached documents for more information. Return to the ED with worsening or concerning symptoms.  °

## 2014-06-23 NOTE — ED Provider Notes (Signed)
CSN: [id number]     [id number] date & time 06/23/14  1152 History   First MD Initiated Contact with Patient 06/23/14 1206     Chief Complaint  Patient presents with  . Headache     (Consider location/radiation/quality/duration/timing/severity/associated sxs/prior Treatment) HPI Comments: Patient is a 49 year old male with a past medical history of hypertension, hyperlipidemia and cervical spine degeneration who presents with a headache for 1 week. Patient reports a gradual onset and progressive worsening of the headache. The pain is sharp, constant and is located in posterior head without radiation. Patient has tried nothing for symptoms without relief. No alleviating/aggravating factors. Patient reports associated nausea and dizziness. Patient denies fever, vomiting, diarrhea, numbness/tingling, weakness, visual changes, congestion, chest pain, SOB, abdominal pain.      Past Medical History  Diagnosis Date  . Hypertension   . Hyperlipidemia   . Cervical spine degeneration     5-6 narrowing of cervical spine   Past Surgical History  Procedure Laterality Date  . Knee arthroscopy      right  . Colonoscopy     No family history on file. History  Substance Use Topics  . Smoking status: Never Smoker   . Smokeless tobacco: Never Used  . Alcohol Use: No    Review of Systems  Constitutional: Negative for fever, chills and fatigue.  HENT: Negative for trouble swallowing.   Eyes: Negative for visual disturbance.  Respiratory: Negative for shortness of breath.   Cardiovascular: Negative for chest pain and palpitations.  Gastrointestinal: Positive for nausea. Negative for vomiting, abdominal pain and diarrhea.  Genitourinary: Negative for dysuria and difficulty urinating.  Musculoskeletal: Negative for arthralgias and neck pain.  Skin: Negative for color change.  Neurological: Positive for light-headedness and headaches. Negative for dizziness and weakness.  Psychiatric/Behavioral:  Negative for dysphoric mood.      Allergies  Niacin  Home Medications   Prior to Admission medications   Medication Sig Start Date End Date Taking? Authorizing Provider  amLODipine (NORVASC) 5 MG tablet Take 5 mg by mouth daily.     Yes Historical Provider, MD  hydrochlorothiazide (HYDRODIURIL) 25 MG tablet Take 25 mg by mouth daily.     Yes Historical Provider, MD  rosuvastatin (CRESTOR) 10 MG tablet Take 20 mg by mouth daily.     Yes Historical Provider, MD   BP 152/87 mmHg  Pulse 81  Temp(Src) 98.2 F (36.8 C) (Oral)  Resp 18  Ht 5\' 8"  (1.727 m)  Wt 194 lb (87.998 kg)  BMI 29.50 kg/m2  SpO2 100% Physical Exam  Constitutional: He is oriented to person, place, and time. He appears well-developed and well-nourished. No distress.  HENT:  Head: Normocephalic and atraumatic.  Mouth/Throat: Oropharynx is clear and moist. No oropharyngeal exudate.  Eyes: Conjunctivae and EOM are normal. Pupils are equal, round, and reactive to light.  Neck: Normal range of motion.  Cardiovascular: Normal rate and regular rhythm.  Exam reveals no gallop and no friction rub.   No murmur heard. Pulmonary/Chest: Effort normal and breath sounds normal. He has no wheezes. He has no rales. He exhibits no tenderness.  Abdominal: Soft. He exhibits no distension. There is no tenderness. There is no rebound.  Musculoskeletal: Normal range of motion.  Neurological: He is alert and oriented to person, place, and time. No cranial nerve deficit. Coordination normal.  Extremity strength and sensation equal and intact bilaterally. Speech is goal-oriented. Moves limbs without ataxia.   Skin: Skin is warm and dry.  Psychiatric:  He has a normal mood and affect. His behavior is normal.  Nursing note and vitals reviewed.   ED Course  Procedures (including critical care time) Labs Review Labs Reviewed - No data to display  Imaging Review Ct Head Wo Contrast  06/23/2014   CLINICAL DATA:  Pt states that since  Sunday he has had a intermittent posterior headache with some dizziness, nausea and tingling in the left side of his face. Hx of htn.  EXAM: CT HEAD WITHOUT CONTRAST  TECHNIQUE: Contiguous axial images were obtained from the base of the skull through the vertex without intravenous contrast.  COMPARISON:  None.  FINDINGS: Ventricles normal in size and configuration. There are no parenchymal masses or mass effect. There is no evidence of an infarct. There are no extra-axial masses or abnormal fluid collections.  There is no intracranial hemorrhage.  Mild mucosal thickening along the posterior floor of the left sphenoid sinus. Remaining visualized sinuses and mastoid air cells are clear. No skull lesion.  IMPRESSION: No intracranial abnormality.   Electronically Signed   By: Amie Portland M.D.   On: 06/23/2014 12:34     EKG Interpretation None      MDM   Final diagnoses:  Other headache syndrome    1:38 PM CT head unremarkable for acute changes. Patient will have fluids, toradol, reglan, and benadryl for headache. Vitals stable and patient afebrile.   2:04 PM Patient reports relief of headache. Patient has no neuro deficits at this time and will be discharged in good condition. Patient instructed to return with worsening or concerning symptoms.   Emilia Beck, PA-C 06/23/14 1405  Toy Cookey, MD 06/24/14 [phone]

## 2014-07-23 DIAGNOSIS — Z6831 Body mass index (BMI) 31.0-31.9, adult: Secondary | ICD-10-CM | POA: Insufficient documentation

## 2014-07-29 DIAGNOSIS — R7303 Prediabetes: Secondary | ICD-10-CM | POA: Insufficient documentation

## 2015-05-07 DIAGNOSIS — R42 Dizziness and giddiness: Secondary | ICD-10-CM | POA: Insufficient documentation

## 2015-06-11 DIAGNOSIS — M4802 Spinal stenosis, cervical region: Secondary | ICD-10-CM | POA: Insufficient documentation

## 2015-06-11 DIAGNOSIS — M47812 Spondylosis without myelopathy or radiculopathy, cervical region: Secondary | ICD-10-CM | POA: Insufficient documentation

## 2016-04-10 DIAGNOSIS — R7309 Other abnormal glucose: Secondary | ICD-10-CM | POA: Insufficient documentation

## 2016-04-10 DIAGNOSIS — Z1159 Encounter for screening for other viral diseases: Secondary | ICD-10-CM | POA: Insufficient documentation

## 2016-04-10 DIAGNOSIS — Z125 Encounter for screening for malignant neoplasm of prostate: Secondary | ICD-10-CM | POA: Insufficient documentation

## 2017-04-27 ENCOUNTER — Telehealth: Payer: Self-pay | Admitting: Family Medicine

## 2017-04-27 NOTE — Telephone Encounter (Signed)
Called and left VM for pt trying to confirm apt tomorrow 04/28/17. Advised of time, building # and time policies. °

## 2017-04-28 ENCOUNTER — Ambulatory Visit: Payer: Self-pay | Admitting: Family Medicine

## 2017-04-28 ENCOUNTER — Encounter: Payer: Self-pay | Admitting: Family Medicine

## 2017-04-28 ENCOUNTER — Other Ambulatory Visit: Payer: Self-pay

## 2017-04-28 VITALS — BP 121/80 | HR 98 | Temp 99.5°F | Resp 18 | Ht 68.0 in | Wt 200.6 lb

## 2017-04-28 DIAGNOSIS — E785 Hyperlipidemia, unspecified: Secondary | ICD-10-CM

## 2017-04-28 DIAGNOSIS — Z125 Encounter for screening for malignant neoplasm of prostate: Secondary | ICD-10-CM

## 2017-04-28 DIAGNOSIS — R7303 Prediabetes: Secondary | ICD-10-CM

## 2017-04-28 DIAGNOSIS — I10 Essential (primary) hypertension: Secondary | ICD-10-CM

## 2017-04-28 DIAGNOSIS — Z0000 Encounter for general adult medical examination without abnormal findings: Secondary | ICD-10-CM

## 2017-04-28 DIAGNOSIS — R6882 Decreased libido: Secondary | ICD-10-CM

## 2017-04-28 DIAGNOSIS — Z5181 Encounter for therapeutic drug level monitoring: Secondary | ICD-10-CM

## 2017-04-28 MED ORDER — ROSUVASTATIN CALCIUM 10 MG PO TABS: 20.0000 mg | ORAL_TABLET | Freq: Every day | ORAL | 3 refills | Status: DC

## 2017-04-28 MED ORDER — AMLODIPINE BESYLATE 5 MG PO TABS: 5.0000 mg | ORAL_TABLET | Freq: Every day | ORAL | 1 refills | Status: DC

## 2017-04-28 MED ORDER — TADALAFIL 5 MG PO TABS: 5.0000 mg | ORAL_TABLET | Freq: Every day | ORAL | 0 refills | Status: DC | PRN

## 2017-04-28 MED ORDER — HYDROCHLOROTHIAZIDE 25 MG PO TABS: 25.0000 mg | ORAL_TABLET | Freq: Every day | ORAL | 1 refills | Status: DC

## 2017-04-28 NOTE — Patient Instructions (Addendum)
   IF you received an x-ray today, you will receive an invoice from [facility]. Please contact [facility] at [phone] with questions or concerns regarding your invoice.   IF you received labwork today, you will receive an invoice from [facility]. Please contact [facility] at [phone] with questions or concerns regarding your invoice.   Our billing staff will not be able to assist you with questions regarding bills from these companies.  You will be contacted with the lab results as soon as they are available. The fastest way to get your results is to activate your My Chart account. Instructions are located on the last page of this paperwork. If you have not heard from us regarding the results in 2 weeks, please contact this office.    Prediabetes Eating Plan Prediabetes-also called impaired glucose tolerance or impaired fasting glucose-is a condition that causes blood sugar (blood glucose) levels to be higher than normal. Following a healthy diet can help to keep prediabetes under control. It can also help to lower the risk of type 2 diabetes and heart disease, which are increased in people who have prediabetes. Along with regular exercise, a healthy diet:  Promotes weight loss.  Helps to control blood sugar levels.  Helps to improve the way that the body uses insulin.  What do I need to know about this eating plan?  Use the glycemic index (GI) to plan your meals. The index tells you how quickly a food will raise your blood sugar. Choose low-GI foods. These foods take a longer time to raise blood sugar.  Pay close attention to the amount of carbohydrates in the food that you eat. Carbohydrates increase blood sugar levels.  Keep track of how many calories you take in. Eating the right amount of calories will help you to achieve a healthy weight. Losing about 7 percent of your starting weight can help to prevent type 2 diabetes.  You may want to follow a  Mediterranean diet. This diet includes a lot of vegetables, lean meats or fish, whole grains, fruits, and healthy oils and fats. What foods can I eat? Grains Whole grains, such as whole-wheat or whole-grain breads, crackers, cereals, and pasta. Unsweetened oatmeal. Bulgur. Barley. Quinoa. Brown rice. Corn or whole-wheat flour tortillas or taco shells. Vegetables Lettuce. Spinach. Peas. Beets. Cauliflower. Cabbage. Broccoli. Carrots. Tomatoes. Squash. Eggplant. Herbs. Peppers. Onions. Cucumbers. Brussels sprouts. Fruits Berries. Bananas. Apples. Oranges. Grapes. Papaya. Mango. Pomegranate. Kiwi. Grapefruit. Cherries. Meats and Other Protein Sources Seafood. Lean meats, such as chicken and turkey or lean cuts of pork and beef. Tofu. Eggs. Nuts. Beans. Dairy Low-fat or fat-free dairy products, such as yogurt, cottage cheese, and cheese. Beverages Water. Tea. Coffee. Sugar-free or diet soda. Seltzer water. Milk. Milk alternatives, such as soy or almond milk. Condiments Mustard. Relish. Low-fat, low-sugar ketchup. Low-fat, low-sugar barbecue sauce. Low-fat or fat-free mayonnaise. Sweets and Desserts Sugar-free or low-fat pudding. Sugar-free or low-fat ice cream and other frozen treats. Fats and Oils Avocado. Walnuts. Olive oil. The items listed above may not be a complete list of recommended foods or beverages. Contact your dietitian for more options. What foods are not recommended? Grains Refined white flour and flour products, such as bread, pasta, snack foods, and cereals. Beverages Sweetened drinks, such as sweet iced tea and soda. Sweets and Desserts Baked goods, such as cake, cupcakes, pastries, cookies, and cheesecake. The items listed above may not be a complete list of foods and beverages to avoid. Contact your dietitian for more information.   This information is not intended to replace advice given to you by your health care provider. Make sure you discuss any questions you have with  your health care provider. Document Released: 07/10/2014 Document Revised: 08/01/2015 Document Reviewed: 03/21/2014 Elsevier Interactive Patient Education  2017 Elsevier Inc.  

## 2017-04-28 NOTE — Progress Notes (Signed)
Chief Complaint  Patient presents with  . Annual Exam    establish care.  Needs refill on amlodipine, hctz and crestor    Subjective:  [name] is a 52 y.o. male here for a health maintenance visit.  Patient is new pt   Erectile dysfunction Pt reports that he has been having difficulty with maintaining erection He wakes up with a morning erection No nocturia No dysuria or hematuria He has some decreased interest in intercourse He has not been exercising as much as he normally does  He denies urinary hesitance or urgency  Patient Active Problem List   Diagnosis Date Noted  . MUSCLE CRAMPS 07/05/2008  . HYPERLIPIDEMIA NEC/NOS 08/13/2006  . HYPERTENSION, ESSENTIAL NOS 08/13/2006  . GERD 08/13/2006  . DEGENERATION, CERVICAL DISC 08/13/2006    Past Medical History:  Diagnosis Date  . Cervical spine degeneration    5-6 narrowing of cervical spine  . Hyperlipidemia   . Hypertension     Past Surgical History:  Procedure Laterality Date  . COLONOSCOPY    . KNEE ARTHROSCOPY     right     Outpatient Medications Prior to Visit  Medication Sig Dispense Refill  . amLODipine (NORVASC) 5 MG tablet Take 5 mg by mouth daily.      . hydrochlorothiazide (HYDRODIURIL) 25 MG tablet Take 25 mg by mouth daily.      . rosuvastatin (CRESTOR) 10 MG tablet Take 20 mg by mouth daily.       No facility-administered medications prior to visit.     Allergies  Allergen Reactions  . Niacin Other (See Comments)    flushing     Family History  Problem Relation Age of Onset  . Diabetes Mother   . Hypertension Mother   . Hyperlipidemia Mother   . Diabetes Father   . Hypertension Father   . Hyperlipidemia Sister   . Hypertension Brother   . Diabetes Brother      Health Habits: Dental Exam: up to date Eye Exam: up to date Exercise: 2-3 times/week on average Current exercise activities: walking/running Diet: balanced  Social History   Socioeconomic History  .  Marital status: Married    Spouse name: Not on file  . Number of children: Not on file  . Years of education: Not on file  . Highest education level: Not on file  Social Needs  . Financial resource strain: Not on file  . Food insecurity - worry: Not on file  . Food insecurity - inability: Not on file  . Transportation needs - medical: Not on file  . Transportation needs - non-medical: Not on file  Occupational History  . Not on file  Tobacco Use  . Smoking status: Never Smoker  . Smokeless tobacco: Never Used  Substance and Sexual Activity  . Alcohol use: No  . Drug use: No    Comment: denies current use  . Sexual activity: Not on file    Comment: occassional  Other Topics Concern  . Not on file  Social History Narrative  . Not on file   Social History   Substance and Sexual Activity  Alcohol Use No   Social History   Tobacco Use  Smoking Status Never Smoker  Smokeless Tobacco Never Used   Social History   Substance and Sexual Activity  Drug Use No   Comment: denies current use     Health Maintenance: See under health Maintenance activity for review of completion dates as well. Immunization History  Administered Date(s) Administered  . Td 03/09/1998     Depression Screen-PHQ2/9 Depression screen PHQ 2/9 04/28/2017  Decreased Interest 0  Down, Depressed, Hopeless 0  PHQ - 2 Score 0       Depression Severity and Treatment Recommendations:  0-4= None  5-9= Mild / Treatment: Support, educate to call if worse; return in one month  10-14= Moderate / Treatment: Support, watchful waiting; Antidepressant or Psycotherapy  15-19= Moderately severe / Treatment: Antidepressant OR Psychotherapy  >= 20 = Major depression, severe / Antidepressant AND Psychotherapy    Review of Systems   Review of Systems  Constitutional: Negative for chills, fever, malaise/fatigue and weight loss.  Eyes: Negative for blurred vision and double vision.  Respiratory: Negative  for cough, shortness of breath and wheezing.   Cardiovascular: Negative for chest pain, palpitations and orthopnea.  Gastrointestinal: Negative for abdominal pain, diarrhea, nausea and vomiting.  Genitourinary: Negative for dysuria, frequency and urgency.  Musculoskeletal: Negative for back pain, joint pain and neck pain.  Skin: Negative for itching and rash.  Neurological: Negative for dizziness, tingling, tremors and headaches.  Psychiatric/Behavioral: Negative for depression. The patient is not nervous/anxious and does not have insomnia.     See HPI for ROS as well.    Objective:   Vitals:   04/28/17 1033  BP: 121/80  Pulse: 98  Resp: 18  Temp: 99.5 F (37.5 C)  TempSrc: Oral  Weight: 200 lb 9.6 oz (91 kg)  Height: 5\' 8"  (1.727 m)    Body mass index is 30.5 kg/m.  Physical Exam  BP 121/80 (BP Location: Left Arm, Patient Position: Sitting, Cuff Size: Normal)   Pulse 98   Temp 99.5 F (37.5 C) (Oral)   Resp 18   Ht 5\' 8"  (1.727 m)   Wt 200 lb 9.6 oz (91 kg)   BMI 30.50 kg/m   General Appearance:    Alert, cooperative, no distress, appears stated age  Head:    Normocephalic, without obvious abnormality, atraumatic  Eyes:    PERRL, conjunctiva/corneas clear, EOM's intact, fundi    benign, both eyes       Ears:    Normal TM's and external ear canals, both ears  Nose:   Nares normal, septum midline, mucosa normal, no drainage   or sinus tenderness  Throat:   Lips, mucosa, and tongue normal; teeth and gums normal  Neck:   Supple, symmetrical, trachea midline, no adenopathy;       thyroid:  No enlargement/tenderness/nodules; no carotid   bruit or JVD  Back:     Symmetric, no curvature, ROM normal, no CVA tenderness  Lungs:     Clear to auscultation bilaterally, respirations unlabored  Chest wall:    No tenderness or deformity  Heart:    Regular rate and rhythm, S1 and S2 normal, no murmur, rub   or gallop  Abdomen:     Soft, non-tender, bowel sounds active all four  quadrants,    no masses, no organomegaly  Extremities:   Extremities normal, atraumatic, no cyanosis or edema  Pulses:   2+ and symmetric all extremities  Skin:   Skin color, texture, turgor normal, no rashes or lesions  Lymph nodes:   Cervical, supraclavicular, and axillary nodes normal  Neurologic:   CNII-XII intact. Normal strength, sensation and reflexes      throughout      Assessment/Plan:   Patient was seen for a health maintenance exam.  Counseled the patient on health maintenance issues. Reviewed her  health mainteance schedule and ordered appropriate tests (see orders.) Counseled on regular exercise and weight management. Recommend regular eye exams and dental cleaning.   The following issues were addressed today for health maintenance:   [name] was seen today for annual exam.  Diagnoses and all orders for this visit:  Encounter for health maintenance examination in adult  Encounter for medication monitoring  Decreased libido- will check underlying factors His erectile dysfunction could be due to the amlodipine Continue bp meds for now and plan to resume exercise -     TestT+TestF+SHBG -     TSH  Dyslipidemia- reviewed previous labs from careeverywhere Will recheck -     TSH -     Lipid panel -     Comprehensive metabolic panel  Essential hypertension -     Comprehensive metabolic panel  Prediabetes- review of care everywhere shows that his a1c was 6.2 a year ago  Will recheck  Gave diet for prediabetes -     Hemoglobin A1c  Screening PSA (prostate specific antigen) -     PSA  Other orders -     amLODipine (NORVASC) 5 MG tablet; Take 1 tablet (5 mg total) by mouth daily. -     rosuvastatin (CRESTOR) 10 MG tablet; Take 2 tablets (20 mg total) by mouth daily. -     hydrochlorothiazide (HYDRODIURIL) 25 MG tablet; Take 1 tablet (25 mg total) by mouth daily. -     tadalafil (CIALIS) 5 MG tablet; Take 1 tablet (5 mg total) by mouth daily as needed for erectile  dysfunction.    No Follow-up on file.    Body mass index is 30.5 kg/m.:  Discussed the patient's BMI with patient. The BMI body mass index is 30.5 kg/m.     Future Appointments  Date Time Provider Department Center  08/26/2017  3:00 PM Doristine Bosworth, MD PCP-PCP [facility]    Patient Instructions       IF you received an x-ray today, you will receive an invoice from [facility]. Please contact [facility] at [phone] with questions or concerns regarding your invoice.   IF you received labwork today, you will receive an invoice from [facility]. Please contact [facility] at [phone] with questions or concerns regarding your invoice.   Our billing staff will not be able to assist you with questions regarding bills from these companies.  You will be contacted with the lab results as soon as they are available. The fastest way to get your results is to activate your My Chart account. Instructions are located on the last page of this paperwork. If you have not heard from [place] regarding the results in 2 weeks, please contact this office.    Prediabetes Eating Plan Prediabetes-also called impaired glucose tolerance or impaired fasting glucose-is a condition that causes blood sugar (blood glucose) levels to be higher than normal. Following a healthy diet can help to keep prediabetes under control. It can also help to lower the risk of type 2 diabetes and heart disease, which are increased in people who have prediabetes. Along with regular exercise, a healthy diet:  Promotes weight loss.  Helps to control blood sugar levels.  Helps to improve the way that the body uses insulin.  What do I need to know about this eating plan?  Use the glycemic index (GI) to plan your meals. The index tells you how quickly a food will raise your blood sugar. Choose low-GI foods. These foods take a longer time to raise  blood sugar.  Pay close attention to the amount of carbohydrates in  the food that you eat. Carbohydrates increase blood sugar levels.  Keep track of how many calories you take in. Eating the right amount of calories will help you to achieve a healthy weight. Losing about 7 percent of your starting weight can help to prevent type 2 diabetes.  You may want to follow a Mediterranean diet. This diet includes a lot of vegetables, lean meats or fish, whole grains, fruits, and healthy oils and fats. What foods can I eat? Grains Whole grains, such as whole-wheat or whole-grain breads, crackers, cereals, and pasta. Unsweetened oatmeal. Bulgur. Barley. Quinoa. Brown rice. Corn or whole-wheat flour tortillas or taco shells. Vegetables Lettuce. Spinach. Peas. Beets. Cauliflower. Cabbage. Broccoli. Carrots. Tomatoes. Squash. Eggplant. Herbs. Peppers. Onions. Cucumbers. Brussels sprouts. Fruits Berries. Bananas. Apples. Oranges. Grapes. Papaya. Mango. Pomegranate. Kiwi. Grapefruit. Cherries. Meats and Other Protein Sources Seafood. Lean meats, such as chicken and [place] or lean cuts of pork and beef. Tofu. Eggs. Nuts. Beans. Dairy Low-fat or fat-free dairy products, such as yogurt, cottage cheese, and cheese. Beverages Water. Tea. Coffee. Sugar-free or diet soda. Seltzer water. Milk. Milk alternatives, such as soy or almond milk. Condiments Mustard. Relish. Low-fat, low-sugar ketchup. Low-fat, low-sugar barbecue sauce. Low-fat or fat-free mayonnaise. Sweets and Desserts Sugar-free or low-fat pudding. Sugar-free or low-fat ice cream and other frozen treats. Fats and Oils Avocado. Walnuts. Olive oil. The items listed above may not be a complete list of recommended foods or beverages. Contact your dietitian for more options. What foods are not recommended? Grains Refined white flour and flour products, such as bread, pasta, snack foods, and cereals. Beverages Sweetened drinks, such as sweet iced tea and soda. Sweets and Desserts Baked goods, such as cake, cupcakes,  pastries, cookies, and cheesecake. The items listed above may not be a complete list of foods and beverages to avoid. Contact your dietitian for more information. This information is not intended to replace advice given to you by your health care provider. Make sure you discuss any questions you have with your health care provider. Document Released: 07/10/2014 Document Revised: 08/01/2015 Document Reviewed: 03/21/2014 Elsevier Interactive Patient Education  2017 [employer].

## 2017-04-29 LAB — PSA: PROSTATE SPECIFIC AG, SERUM: 1.4 ng/mL (ref 0.0–4.0)

## 2017-05-01 LAB — LIPID PANEL
CHOLESTEROL TOTAL: 303 mg/dL — AB (ref 100–199)
Chol/HDL Ratio: 8.4 ratio — ABNORMAL HIGH (ref 0.0–5.0)
HDL: 36 mg/dL — ABNORMAL LOW (ref >39)
LDL Calculated: 227 mg/dL — ABNORMAL HIGH (ref 0–99)
Triglycerides: 201 mg/dL — ABNORMAL HIGH (ref 0–149)
VLDL Cholesterol Cal: 40 mg/dL (ref 5–40)

## 2017-05-01 LAB — COMPREHENSIVE METABOLIC PANEL
ALBUMIN: 4.9 g/dL (ref 3.5–5.5)
ALK PHOS: 76 IU/L (ref 39–117)
ALT: 29 IU/L (ref 0–44)
AST: 24 IU/L (ref 0–40)
Albumin/Globulin Ratio: 1.6 (ref 1.2–2.2)
BUN / CREAT RATIO: 16 (ref 9–20)
BUN: 19 mg/dL (ref 6–24)
Bilirubin Total: 0.3 mg/dL (ref 0.0–1.2)
CHLORIDE: 97 mmol/L (ref 96–106)
CO2: 23 mmol/L (ref 20–29)
Calcium: 9.7 mg/dL (ref 8.7–10.2)
Creatinine, Ser: 1.16 mg/dL (ref 0.76–1.27)
GFR calc Af Amer: 82 mL/min/{1.73_m2} (ref >59)
GFR calc non Af Amer: 71 mL/min/{1.73_m2} (ref >59)
GLOBULIN, TOTAL: 3.1 g/dL (ref 1.5–4.5)
GLUCOSE: 108 mg/dL — AB (ref 65–99)
Potassium: 3.4 mmol/L — ABNORMAL LOW (ref 3.5–5.2)
SODIUM: 139 mmol/L (ref 134–144)
Total Protein: 8 g/dL (ref 6.0–8.5)

## 2017-05-01 LAB — TSH: TSH: 3.73 u[IU]/mL (ref 0.450–4.500)

## 2017-05-01 LAB — TESTT+TESTF+SHBG
Sex Hormone Binding: 25.6 nmol/L (ref 19.3–76.4)
TESTOSTERONE, TOTAL: 315.2 ng/dL (ref 264.0–916.0)
Testosterone, Free: 6.8 pg/mL — ABNORMAL LOW (ref 7.2–24.0)

## 2017-05-01 LAB — HEMOGLOBIN A1C
Est. average glucose Bld gHb Est-mCnc: 137 mg/dL
Hgb A1c MFr Bld: 6.4 % — ABNORMAL HIGH (ref 4.8–5.6)

## 2017-05-07 ENCOUNTER — Other Ambulatory Visit: Payer: Self-pay | Admitting: Family Medicine

## 2017-05-07 MED ORDER — METFORMIN HCL 500 MG PO TABS: 500.0000 mg | ORAL_TABLET | Freq: Every day | ORAL | 1 refills | Status: DC

## 2017-05-07 MED ORDER — ROSUVASTATIN CALCIUM 40 MG PO TABS: 40.0000 mg | ORAL_TABLET | Freq: Every day | ORAL | 0 refills | Status: DC

## 2017-05-07 NOTE — Progress Notes (Signed)
Discussed by phone call the medication that were prescribed He understood instructions and verbalized understanding.

## 2017-08-19 ENCOUNTER — Telehealth: Payer: Self-pay | Admitting: Family Medicine

## 2017-08-19 NOTE — Telephone Encounter (Signed)
mychart message sent to pt about [facility]

## 2017-08-26 ENCOUNTER — Ambulatory Visit: Payer: Self-pay | Admitting: Family Medicine

## 2017-09-11 ENCOUNTER — Other Ambulatory Visit: Payer: Self-pay

## 2017-09-11 ENCOUNTER — Encounter: Payer: Self-pay | Admitting: Family Medicine

## 2017-09-11 ENCOUNTER — Ambulatory Visit: Payer: BLUE CROSS/BLUE SHIELD | Admitting: Family Medicine

## 2017-09-11 ENCOUNTER — Ambulatory Visit (INDEPENDENT_AMBULATORY_CARE_PROVIDER_SITE_OTHER): Payer: BLUE CROSS/BLUE SHIELD

## 2017-09-11 VITALS — BP 122/80 | HR 48 | Temp 98.0°F | Resp 18 | Ht 68.0 in | Wt 207.0 lb

## 2017-09-11 DIAGNOSIS — I10 Essential (primary) hypertension: Secondary | ICD-10-CM | POA: Diagnosis not present

## 2017-09-11 DIAGNOSIS — M25561 Pain in right knee: Secondary | ICD-10-CM

## 2017-09-11 DIAGNOSIS — M1711 Unilateral primary osteoarthritis, right knee: Secondary | ICD-10-CM | POA: Diagnosis not present

## 2017-09-11 DIAGNOSIS — M25461 Effusion, right knee: Secondary | ICD-10-CM

## 2017-09-11 DIAGNOSIS — R7303 Prediabetes: Secondary | ICD-10-CM

## 2017-09-11 LAB — POCT GLYCOSYLATED HEMOGLOBIN (HGB A1C): Hemoglobin A1C: 6 % — AB (ref 4.0–5.6)

## 2017-09-11 LAB — POCT URINALYSIS DIP (MANUAL ENTRY)
Bilirubin, UA: NEGATIVE
Blood, UA: NEGATIVE
GLUCOSE UA: NEGATIVE mg/dL
Ketones, POC UA: NEGATIVE mg/dL
Leukocytes, UA: NEGATIVE
NITRITE UA: NEGATIVE
Protein Ur, POC: NEGATIVE mg/dL
Spec Grav, UA: 1.025 (ref 1.010–1.025)
UROBILINOGEN UA: 0.2 U/dL
pH, UA: 6 (ref 5.0–8.0)

## 2017-09-11 LAB — GLUCOSE, POCT (MANUAL RESULT ENTRY): POC GLUCOSE: 89 mg/dL (ref 70–99)

## 2017-09-11 MED ORDER — MELOXICAM 7.5 MG PO TABS: 7.5000 mg | ORAL_TABLET | Freq: Every day | ORAL | 0 refills | Status: AC

## 2017-09-11 NOTE — Patient Instructions (Addendum)
IF you received an x-ray today, you will receive an invoice from [facility]. Please contact [facility] at [phone] with questions or concerns regarding your invoice.   IF you received labwork today, you will receive an invoice from [facility]. Please contact [facility] at [phone] with questions or concerns regarding your invoice.   Our billing staff will not be able to assist you with questions regarding bills from these companies.  You will be contacted with the lab results as soon as they are available. The fastest way to get your results is to activate your My Chart account. Instructions are located on the last page of this paperwork. If you have not heard from [place] regarding the results in 2 weeks, please contact this office.    ; Arthritis Arthritis is a term that is commonly used to refer to joint pain or joint disease. There are more than 100 types of arthritis. What are the causes? The most common cause of this condition is wear and tear of a joint. Other causes include:  Gout.  Inflammation of a joint.  An infection of a joint.  Sprains and other injuries near the joint.  A drug reaction or allergic reaction.  In some cases, the cause may not be known. What are the signs or symptoms? The main symptom of this condition is pain in the joint with movement. Other symptoms include:  Redness, swelling, or stiffness at a joint.  Warmth coming from the joint.  Fever.  Overall feeling of illness.  How is this diagnosed? This condition may be diagnosed with a physical exam and tests, including:  Blood tests.  Urine tests.  Imaging tests, such as MRI, X-rays, or a CT scan.  Sometimes, fluid is removed from a joint for testing. How is this treated? Treatment for this condition may involve:  Treatment of the cause, if it is known.  Rest.  Raising (elevating) the joint.  Applying cold or hot packs to the joint.  Medicines to improve  symptoms and reduce inflammation.  Injections of a steroid such as cortisone into the joint to help reduce pain and inflammation.  Depending on the cause of your arthritis, you may need to make lifestyle changes to reduce stress on your joint. These changes may include exercising more and losing weight. Follow these instructions at home: Medicines  Take over-the-counter and prescription medicines only as told by your health care provider.  Do not take aspirin to relieve pain if gout is suspected. Activity  Rest your joint if told by your health care provider. Rest is important when your disease is active and your joint feels painful, swollen, or stiff.  Avoid activities that make the pain worse. It is important to balance activity with rest.  Exercise your joint regularly with range-of-motion exercises as told by your health care provider. Try doing low-impact exercise, such as: ? Swimming. ? Water aerobics. ? Biking. ? Walking. Joint Care   If your joint is swollen, keep it elevated if told by your health care provider.  If your joint feels stiff in the morning, try taking a warm shower.  If directed, apply heat to the joint. If you have diabetes, do not apply heat without permission from your health care provider. ? Put a towel between the joint and the hot pack or heating pad. ? Leave the heat on the area for 20-30 minutes.  If directed, apply ice to the joint: ? Put ice in a plastic bag. ? Place  a towel between your skin and the bag. ? Leave the ice on for 20 minutes, 2-3 times per day.  Keep all follow-up visits as told by your health care provider. This is important. Contact a health care provider if:  The pain gets worse.  You have a fever. Get help right away if:  You develop severe joint pain, swelling, or redness.  Many joints become painful and swollen.  You develop severe back pain.  You develop severe weakness in your leg.  You cannot control your  bladder or bowels. This information is not intended to replace advice given to you by your health care provider. Make sure you discuss any questions you have with your health care provider. Document Released: 04/02/2004 Document Revised: 08/01/2015 Document Reviewed: 05/21/2014 Elsevier Interactive Patient Education  2018 [employer].  Arthritis Arthritis is a term that is commonly used to refer to joint pain or joint disease. There are more than 100 types of arthritis. What are the causes? The most common cause of this condition is wear and tear of a joint. Other causes include:  Gout.  Inflammation of a joint.  An infection of a joint.  Sprains and other injuries near the joint.  A drug reaction or allergic reaction.  In some cases, the cause may not be known. What are the signs or symptoms? The main symptom of this condition is pain in the joint with movement. Other symptoms include:  Redness, swelling, or stiffness at a joint.  Warmth coming from the joint.  Fever.  Overall feeling of illness.  How is this diagnosed? This condition may be diagnosed with a physical exam and tests, including:  Blood tests.  Urine tests.  Imaging tests, such as MRI, X-rays, or a CT scan.  Sometimes, fluid is removed from a joint for testing. How is this treated? Treatment for this condition may involve:  Treatment of the cause, if it is known.  Rest.  Raising (elevating) the joint.  Applying cold or hot packs to the joint.  Medicines to improve symptoms and reduce inflammation.  Injections of a steroid such as cortisone into the joint to help reduce pain and inflammation.  Depending on the cause of your arthritis, you may need to make lifestyle changes to reduce stress on your joint. These changes may include exercising more and losing weight. Follow these instructions at home: Medicines  Take over-the-counter and prescription medicines only as told by your health care  provider.  Do not take aspirin to relieve pain if gout is suspected. Activity  Rest your joint if told by your health care provider. Rest is important when your disease is active and your joint feels painful, swollen, or stiff.  Avoid activities that make the pain worse. It is important to balance activity with rest.  Exercise your joint regularly with range-of-motion exercises as told by your health care provider. Try doing low-impact exercise, such as: ? Swimming. ? Water aerobics. ? Biking. ? Walking. Joint Care   If your joint is swollen, keep it elevated if told by your health care provider.  If your joint feels stiff in the morning, try taking a warm shower.  If directed, apply heat to the joint. If you have diabetes, do not apply heat without permission from your health care provider. ? Put a towel between the joint and the hot pack or heating pad. ? Leave the heat on the area for 20-30 minutes.  If directed, apply ice to the joint: ? Put  ice in a plastic bag. ? Place a towel between your skin and the bag. ? Leave the ice on for 20 minutes, 2-3 times per day.  Keep all follow-up visits as told by your health care provider. This is important. Contact a health care provider if:  The pain gets worse.  You have a fever. Get help right away if:  You develop severe joint pain, swelling, or redness.  Many joints become painful and swollen.  You develop severe back pain.  You develop severe weakness in your leg.  You cannot control your bladder or bowels. This information is not intended to replace advice given to you by your health care provider. Make sure you discuss any questions you have with your health care provider. Document Released: 04/02/2004 Document Revised: 08/01/2015 Document Reviewed: 05/21/2014 Elsevier Interactive Patient Education  [employer]2018 Elsevier Inc.

## 2017-09-11 NOTE — Progress Notes (Signed)
Chief Complaint  Patient presents with  . Diabetes    4 month follow up on his diabetes  . Hypertension    follow up; states he is on Jonathan different bp med    HPI  Right knee pain Right knee is swollen daily Denies pain today Reports that it depend on what he is doing  If he is in the woods working on the right knee bended He goes up and down hills He states that when he sits for Jonathan while there is no popping or locking Climbing stairs it feels like there is fluid in it  Hypertension: Patient here for follow-up of elevated blood pressure. He is exercising and is adherent to low salt diet.  Blood pressure is well controlled at home. Cardiac symptoms none. Patient denies chest pressure/discomfort, claudication, dyspnea, fatigue, irregular heart beat, lower extremity edema, orthopnea, palpitations and syncope.  Cardiovascular risk factors: hypertension and male gender. Use of agents associated with hypertension: none. History of target organ damage: none. BP Readings from Last 3 Encounters:  09/11/17 122/80  04/28/17 121/80  06/23/14 117/65   He did not take his morning bp meds He reports that he normally takes amlodipine and chlorthalidone   Prediabetes He is taking the metformin He states that he does not notice side effects He takes it daily He denies hypoglycemia, sweats, nausea, fatigue  Lab Results  Component Value Date   HGBA1C 6.0 (Jonathan) 09/11/2017      Past Medical History:  Diagnosis Date  . Cervical spine degeneration    5-6 narrowing of cervical spine  . Hyperlipidemia   . Hypertension     Current Outpatient Medications  Medication Sig Dispense Refill  . amLODipine (NORVASC) 5 MG tablet Take 1 tablet (5 mg total) by mouth daily. 90 tablet 1  . chlorthalidone (HYGROTON) 25 MG tablet Take by mouth.    . metFORMIN (GLUCOPHAGE) 500 MG tablet Take 1 tablet (500 mg total) by mouth daily with breakfast. 90 tablet 1  . rosuvastatin (CRESTOR) 40 MG tablet Take 1 tablet  (40 mg total) by mouth daily. 90 tablet 0  . meloxicam (MOBIC) 7.5 MG tablet Take 1 tablet (7.5 mg total) by mouth daily. 30 tablet 0   No current facility-administered medications for this visit.     Allergies:  Allergies  Allergen Reactions  . Niacin Other (See Comments)    flushing    Past Surgical History:  Procedure Laterality Date  . COLONOSCOPY    . KNEE ARTHROSCOPY     right    Social History   Socioeconomic History  . Marital status: Married    Spouse name: Not on file  . Number of children: Not on file  . Years of education: Not on file  . Highest education level: Not on file  Occupational History  . Not on file  Social Needs  . Financial resource strain: Not on file  . Food insecurity:    Worry: Not on file    Inability: Not on file  . Transportation needs:    Medical: Not on file    Non-medical: Not on file  Tobacco Use  . Smoking status: Never Smoker  . Smokeless tobacco: Never Used  Substance and Sexual Activity  . Alcohol use: No  . Drug use: No    Types: Other-see comments    Comment: denies current use  . Sexual activity: Not on file    Comment: occassional  Lifestyle  . Physical activity:    Days  per week: Not on file    Minutes per session: Not on file  . Stress: Not on file  Relationships  . Social connections:    Talks on phone: Not on file    Gets together: Not on file    Attends religious service: Not on file    Active member of club or organization: Not on file    Attends meetings of clubs or organizations: Not on file    Relationship status: Not on file  Other Topics Concern  . Not on file  Social History Narrative  . Not on file    Family History  Problem Relation Age of Onset  . Diabetes Mother   . Hypertension Mother   . Hyperlipidemia Mother   . Diabetes Father   . Hypertension Father   . Hyperlipidemia Sister   . Hypertension Brother   . Diabetes Brother      ROS Review of Systems See HPI Constitution: No  fevers or chills No malaise No diaphoresis Skin: No rash or itching Eyes: no blurry vision, no double vision GU: no dysuria or hematuria Neuro: no dizziness or headaches all others reviewed and negative   Objective: Vitals:   09/11/17 0924 09/11/17 1008  BP: (!) 152/84 122/80  Pulse: (!) 48   Resp: 18   Temp: 98 F (36.7 C)   TempSrc: Oral   SpO2: 100%   Weight: 207 lb (93.9 kg)   Height: 5\' 8"  (1.727 m)     Physical Exam Physical Exam  Constitutional: She is oriented to person, place, and time. She appears well-developed and well-nourished.  HENT:  Head: Normocephalic and atraumatic.  Eyes: Conjunctivae and EOM are normal.  Cardiovascular: Normal rate, regular rhythm and normal heart sounds.   Pulmonary/Chest: Effort normal and breath sounds normal. No respiratory distress. She has no wheezes.  Neurological: She is alert and oriented to person, place, and time.   Knee exam- left knee normal Right knee with tenderness along the medial joint line  Small pocket of effusion Nontender to palpation of the patellar tendon Negative apprehension test Stable joint  CLINICAL DATA:  Pain and swelling  EXAM: RIGHT KNEE - COMPLETE 4+ VIEW  COMPARISON:  None.  FINDINGS: Frontal, lateral, and bilateral oblique views were obtained. No evident fracture or dislocation. There is Jonathan moderate joint effusion. There is mild narrowing medially and in the patellofemoral joint regions. There is mild spurring in all compartments. No erosive change.  IMPRESSION: Areas of osteoarthritic change. Moderate joint effusion. No fracture or dislocation.   Electronically Signed   By: Bretta BangWilliam  Woodruff III M.D.   On: 09/11/2017 10:55    Assessment and Plan Jonathan LongsJoseph was seen today for diabetes and hypertension.  Diagnoses and all orders for this visit:  Essential hypertension- Patient's blood pressure is at goal of 139/89 or less. Condition is stable. Continue current medications  and treatment plan. I recommend that you exercise for 30-45 minutes 5 days Jonathan week. I also recommend Jonathan balanced diet with fruits and vegetables every day, lean meats, and little fried foods. The DASH diet (you can find this online) is Jonathan good example of this.  -     Comprehensive metabolic panel -     Lipid panel -     POCT urinalysis dipstick  Prediabetes- improved with metformin and exercise -     POCT glycosylated hemoglobin (Hb A1C) -     POCT glucose (manual entry) -     POCT urinalysis dipstick  Pain  and swelling of knee, right- advised Jonathan trial of meloxicam Referred to Orthopedics -     DG Knee Complete 4 Views Right; Future -     Ambulatory referral to Orthopedic Surgery  Primary osteoarthritis of right knee- due to arthritis and spurring will refer to Orthopedics -     Ambulatory referral to Orthopedic Surgery  Other orders -     Cancel: Tdap vaccine greater than or equal to 7yo IM -     meloxicam (MOBIC) 7.5 MG tablet; Take 1 tablet (7.5 mg total) by mouth daily.     [name] [name]

## 2017-09-12 LAB — COMPREHENSIVE METABOLIC PANEL
ALBUMIN: 4.4 g/dL (ref 3.5–5.5)
ALK PHOS: 77 IU/L (ref 39–117)
ALT: 20 IU/L (ref 0–44)
AST: 24 IU/L (ref 0–40)
Albumin/Globulin Ratio: 1.7 (ref 1.2–2.2)
BUN/Creatinine Ratio: 12 (ref 9–20)
BUN: 12 mg/dL (ref 6–24)
Bilirubin Total: <0.2 mg/dL (ref 0.0–1.2)
CHLORIDE: 105 mmol/L (ref 96–106)
CO2: 22 mmol/L (ref 20–29)
CREATININE: 1.02 mg/dL (ref 0.76–1.27)
Calcium: 9.3 mg/dL (ref 8.7–10.2)
GFR calc Af Amer: 96 mL/min/{1.73_m2} (ref >59)
GFR calc non Af Amer: 83 mL/min/{1.73_m2} (ref >59)
GLOBULIN, TOTAL: 2.6 g/dL (ref 1.5–4.5)
Glucose: 99 mg/dL (ref 65–99)
Potassium: 3.7 mmol/L (ref 3.5–5.2)
Sodium: 141 mmol/L (ref 134–144)
Total Protein: 7 g/dL (ref 6.0–8.5)

## 2017-09-12 LAB — LIPID PANEL
CHOLESTEROL TOTAL: 205 mg/dL — AB (ref 100–199)
Chol/HDL Ratio: 6.6 ratio — ABNORMAL HIGH (ref 0.0–5.0)
HDL: 31 mg/dL — ABNORMAL LOW (ref >39)
LDL CALC: 129 mg/dL — AB (ref 0–99)
TRIGLYCERIDES: 225 mg/dL — AB (ref 0–149)
VLDL CHOLESTEROL CAL: 45 mg/dL — AB (ref 5–40)

## 2017-12-30 ENCOUNTER — Other Ambulatory Visit: Payer: Self-pay | Admitting: Family Medicine

## 2017-12-30 NOTE — Telephone Encounter (Signed)
Requested Prescriptions  Pending Prescriptions Disp Refills  . rosuvastatin (CRESTOR) 40 MG tablet [Pharmacy Med Name: ROSUVASTATIN 40MG  TABLETS] 90 tablet 0    Sig: TAKE 1 TABLET BY MOUTH ONCE DAILY     Cardiovascular:  Antilipid - Statins Failed - 12/30/2017 11:59 AM      Failed - Total Cholesterol in normal range and within 360 days    Cholesterol, Total  Date Value Ref Range Status  09/11/2017 205 (H) 100 - 199 mg/dL Final         Failed - LDL in normal range and within 360 days    LDL Calculated  Date Value Ref Range Status  09/11/2017 129 (H) 0 - 99 mg/dL Final         Failed - HDL in normal range and within 360 days    HDL  Date Value Ref Range Status  09/11/2017 31 (L) >39 mg/dL Final         Failed - Triglycerides in normal range and within 360 days    Triglycerides  Date Value Ref Range Status  09/11/2017 225 (H) 0 - 149 mg/dL Final         Passed - Patient is not pregnant      Passed - Valid encounter within last 12 months    Recent Outpatient Visits          3 months ago Essential hypertension   [facility], Manus Rudd, MD   8 months ago Encounter for health maintenance examination in adult   [facility] at [facility], Manus Rudd, MD

## 2018-01-17 ENCOUNTER — Encounter: Payer: Self-pay | Admitting: Family Medicine

## 2018-01-17 ENCOUNTER — Ambulatory Visit: Payer: BLUE CROSS/BLUE SHIELD | Admitting: Family Medicine

## 2018-01-17 ENCOUNTER — Other Ambulatory Visit: Payer: Self-pay

## 2018-01-17 VITALS — BP 124/72 | HR 71 | Temp 98.0°F | Resp 16 | Ht 68.0 in | Wt 206.4 lb

## 2018-01-17 DIAGNOSIS — E785 Hyperlipidemia, unspecified: Secondary | ICD-10-CM

## 2018-01-17 DIAGNOSIS — I10 Essential (primary) hypertension: Secondary | ICD-10-CM | POA: Diagnosis not present

## 2018-01-17 DIAGNOSIS — E6609 Other obesity due to excess calories: Secondary | ICD-10-CM | POA: Diagnosis not present

## 2018-01-17 DIAGNOSIS — Z6831 Body mass index (BMI) 31.0-31.9, adult: Secondary | ICD-10-CM

## 2018-01-17 DIAGNOSIS — R7303 Prediabetes: Secondary | ICD-10-CM

## 2018-01-17 LAB — POCT GLYCOSYLATED HEMOGLOBIN (HGB A1C): HEMOGLOBIN A1C: 6 % — AB (ref 4.0–5.6)

## 2018-01-17 LAB — LIPID PANEL
Chol/HDL Ratio: 4.2 ratio (ref 0.0–5.0)
Cholesterol, Total: 172 mg/dL (ref 100–199)
HDL: 41 mg/dL (ref >39)
LDL Calculated: 110 mg/dL — ABNORMAL HIGH (ref 0–99)
Triglycerides: 103 mg/dL (ref 0–149)
VLDL CHOLESTEROL CAL: 21 mg/dL (ref 5–40)

## 2018-01-17 NOTE — Patient Instructions (Signed)
° ° ° °  If you have lab work done today you will be contacted with your lab results within the next 2 weeks.  If you have not heard from us then please contact us. The fastest way to get your results is to register for My Chart. ° ° °IF you received an x-ray today, you will receive an invoice from [facility]. Please contact [facility] at [phone] with questions or concerns regarding your invoice.  ° °IF you received labwork today, you will receive an invoice from [facility]. Please contact [facility] at [phone] with questions or concerns regarding your invoice.  ° °Our billing staff will not be able to assist you with questions regarding bills from these companies. ° °You will be contacted with the lab results as soon as they are available. The fastest way to get your results is to activate your My Chart account. Instructions are located on the last page of this paperwork. If you have not heard from us regarding the results in 2 weeks, please contact this office. °  ° ° ° °

## 2018-01-17 NOTE — Progress Notes (Signed)
Chief Complaint  Patient presents with  . 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 He is exercising and is adherent to low salt diet.  Blood pressure is well controlled at home. Cardiac symptoms none. Patient denies chest pain, chest pressure/discomfort, claudication, dyspnea, exertional chest pressure/discomfort, fatigue and irregular heart beat.  Cardiovascular risk factors: hypertension and hyperlipidemia. Use of agents associated with hypertension: none. History of target organ damage: none. BP Readings from Last 3 Encounters:  01/17/18 124/72  09/11/17 122/80  04/28/17 121/80      Past Medical History:  Diagnosis Date  . Cervical spine degeneration    5-6 narrowing of cervical spine  . Hyperlipidemia   . Hypertension     Current Outpatient Medications  Medication Sig Dispense Refill  . amLODipine (NORVASC) 5 MG tablet Take 1 tablet (5 mg total) by mouth daily. 90 tablet 1  . chlorthalidone (HYGROTON) 25 MG tablet Take by mouth.    . meloxicam (MOBIC) 7.5 MG tablet Take 1 tablet (7.5 mg total) by mouth daily. 30 tablet 0  . metFORMIN (GLUCOPHAGE) 500 MG tablet Take 1 tablet (500 mg total) by mouth daily with breakfast. 90 tablet 1  . rosuvastatin (CRESTOR) 40 MG tablet TAKE 1 TABLET BY MOUTH ONCE DAILY 90 tablet 0   No current facility-administered medications for this visit.     Allergies:  Allergies  Allergen Reactions  . Niacin Other (See Comments)    flushing    Past Surgical History:  Procedure  Laterality Date  . COLONOSCOPY    . KNEE ARTHROSCOPY     right    Social History   Socioeconomic History  . Marital status: Married    Spouse name: Not on file  . Number of children: Not on file  . Years of education: Not on file  . Highest education level: Not on file  Occupational History  . Not on file  Social Needs  . Financial resource strain: Not on file  . Food insecurity:    Worry: Not on file    Inability: Not on file  . Transportation needs:    Medical: Not on file    Non-medical: Not on file  Tobacco Use  . Smoking status: Never Smoker  . Smokeless tobacco: Never Used  Substance and Sexual Activity  . Alcohol use: No  . Drug use: No    Types: Other-see comments    Comment: denies current use  . Sexual activity: Not on file    Comment: occassional  Lifestyle  . Physical activity:    Days per week: Not on file    Minutes per session: Not on file  . Stress: Not on file  Relationships  . Social connections:    Talks on phone: Not on file    Gets together: Not on file    Attends religious service: Not on file    Active member of club or organization: Not on file  Attends meetings of clubs or organizations: Not on file    Relationship status: Not on file  Other Topics Concern  . Not on file  Social History Narrative  . Not on file    Family History  Problem Relation Age of Onset  . Diabetes Mother   . Hypertension Mother   . Hyperlipidemia Mother   . Diabetes Father   . Hypertension Father   . Hyperlipidemia Sister   . Hypertension Brother   . Diabetes Brother      ROS Review of Systems See HPI Constitution: No fevers or chills No malaise No diaphoresis Skin: No rash or itching Eyes: no blurry vision, no double vision GU: no dysuria or hematuria Neuro: no dizziness or headaches * all others reviewed and negative   Objective: Vitals:   01/17/18 0952  BP: 124/72  Pulse: 71  Resp: 16  Temp: 98 F (36.7 C)  TempSrc: Oral  SpO2:  99%  Weight: 206 lb 6.4 oz (93.6 kg)  Height: 5\' 8"  (1.727 m)    Physical Exam  Constitutional: He is oriented to person, place, and time. He appears well-developed and well-nourished.  HENT:  Head: Normocephalic and atraumatic.  Eyes: Conjunctivae and EOM are normal.  Cardiovascular: Normal rate, regular rhythm, normal heart sounds and intact distal pulses.  No murmur heard. Pulmonary/Chest: Effort normal and breath sounds normal. No stridor. No respiratory distress. He has no wheezes.  Neurological: He is alert and oriented to person, place, and time.  Skin: Skin is warm. Capillary refill takes less than 2 seconds.  Psychiatric: He has a normal mood and affect. His behavior is normal. Judgment and thought content normal.    Assessment and Plan Dammon was seen today for obesity.  Diagnoses and all orders for this visit:  Prediabetes-  Improved on metformin -     POCT glycosylated hemoglobin (Hb A1C)  Dyslipidemia- improved on statin and lifestyle modification -     Lipid panel  Essential hypertension- improved with exercise and dietary modification as well as current medication dose, cpm  Obesity- body composition mostly muscle Patient has a healthy diet    Mahogany Torrance A Mikela Senn

## 2018-04-08 ENCOUNTER — Ambulatory Visit: Payer: Self-pay | Admitting: *Deleted

## 2018-04-08 NOTE — Telephone Encounter (Signed)
Pt called with intermittent "heart fluttering below his heart"; for the past 2-3 weeks; he says that he notices it more when he is relaxing; the pt says that he has an issue with eating (his wife has to make him eat); he would also like to discuss medication; recommendations made per nurse triage protocol; he would like to see Dr Eldred Manges on 04/11/2018; she has no availability; pt offered and accepted appointment with Raliegh Ip, [address], 04/09/2018 at 1100; he verbalized understanding. Reason for Disposition . [1] Palpitations AND [2] no improvement after using CARE ADVICE  Answer Assessment - Initial Assessment Questions 1. DESCRIPTION: "Please describe your heart rate or heart beat that you are having" (e.g., fast/slow, regular/irregular, skipped or extra beats, "palpitations")     flutter 2. ONSET: "When did it start?" (Minutes, hours or days)      2-3 weeks ago 3. DURATION: "How long does it last" (e.g., seconds, minutes, hours)     seconds 4. PATTERN "Does it come and go, or has it been constant since it started?"  "Does it get worse with exertion?"   "Are you feeling it now?"     intermittent 5. TAP: "Using your hand, can you tap out what you are feeling on a chair or table in front of you, so that I can hear?" (Note: not all patients can do this)       normal 6. HEART RATE: "Can you tell me your heart rate?" "How many beats in 15 seconds?"  (Note: not all patients can do this)       19 beats in 16 seconds 7. RECURRENT SYMPTOM: "Have you ever had this before?" If so, ask: "When was the last time?" and "What happened that time?"      no 8. CAUSE: "What do you think is causing the palpitations?"     Eating habits (poor appetite) 9. CARDIAC HISTORY: "Do you have any history of heart disease?" (e.g., heart attack, angina, bypass surgery, angioplasty, arrhythmia)      Seen by cardiology; said medication for sinuses made heart rate increase  10. OTHER SYMPTOMS: "Do you have any other  symptoms?" (e.g., dizziness, chest pain, sweating, difficulty breathing)       Light headed when working out (pt takes metformin but does not check blood sugar) 11. PREGNANCY: "Is there any chance you are pregnant?" "When was your last menstrual period?"       n/a  Protocols used: HEART RATE AND HEARTBEAT QUESTIONS-A-AH

## 2018-04-09 ENCOUNTER — Encounter: Payer: Self-pay | Admitting: Family Medicine

## 2018-04-09 ENCOUNTER — Ambulatory Visit: Payer: BLUE CROSS/BLUE SHIELD | Admitting: Family Medicine

## 2018-04-09 ENCOUNTER — Other Ambulatory Visit: Payer: Self-pay

## 2018-04-09 VITALS — BP 132/64 | HR 63 | Temp 98.7°F | Ht 68.0 in | Wt 206.8 lb

## 2018-04-09 DIAGNOSIS — R9431 Abnormal electrocardiogram [ECG] [EKG]: Secondary | ICD-10-CM | POA: Diagnosis not present

## 2018-04-09 DIAGNOSIS — R0789 Other chest pain: Secondary | ICD-10-CM | POA: Insufficient documentation

## 2018-04-09 DIAGNOSIS — R002 Palpitations: Secondary | ICD-10-CM | POA: Diagnosis not present

## 2018-04-09 DIAGNOSIS — I10 Essential (primary) hypertension: Secondary | ICD-10-CM

## 2018-04-09 DIAGNOSIS — R7303 Prediabetes: Secondary | ICD-10-CM

## 2018-04-09 DIAGNOSIS — R0602 Shortness of breath: Secondary | ICD-10-CM | POA: Diagnosis not present

## 2018-04-09 DIAGNOSIS — Z09 Encounter for follow-up examination after completed treatment for conditions other than malignant neoplasm: Secondary | ICD-10-CM

## 2018-04-09 MED ORDER — NITROGLYCERIN 0.4 MG SL SUBL: 0.4000 mg | SUBLINGUAL_TABLET | SUBLINGUAL | 3 refills | Status: AC | PRN

## 2018-04-09 NOTE — Progress Notes (Signed)
Established Patient Office Visit  Subjective:  Patient ID: [name], male    DOB: 12-Dec-1965  Age: 53 y.o. MRN: [id number]  CC:  Chief Complaint  Patient presents with  . flutter on left rib    2 weeks off and on. (notices it while relax)    HPI [name] is a 53 year old male who presents for follow up.   Past Medical History:  Diagnosis Date  . Cervical spine degeneration    5-6 narrowing of cervical spine  . Hyperlipidemia   . Hypertension     Current Status: Since his last office visit, he has c/o intermittent heart palpitations, shortness of breath, and occasional chest discomfort. She denies visual changes, chest pain, cough, shortness of breath, heart palpitations, and falls. He has occasionally headaches and dizziness with position changes. Denies severe headaches, confusion, seizures, double vision, and blurred vision, nausea and vomiting.  He denies fevers, chills, fatigue, recent infections, weight loss, and night sweats. He has not had any  and falls. No reports of GI problems such as nausea, vomiting, diarrhea, and constipation. He has no reports of blood in stools, dysuria and hematuria. No depression or anxiety reported. He denies pain today.   Past Surgical History:  Procedure Laterality Date  . COLONOSCOPY    . KNEE ARTHROSCOPY     right    Family History  Problem Relation Age of Onset  . Diabetes Mother   . Hypertension Mother   . Hyperlipidemia Mother   . Diabetes Father   . Hypertension Father   . Hyperlipidemia Sister   . Hypertension Brother   . Diabetes Brother    Social History   Socioeconomic History  . Marital status: Married    Spouse name: Not on file  . Number of children: Not on file  . Years of education: Not on file  . Highest education level: Not on file  Occupational History  . Not on file  Social Needs  . Financial resource strain: Not on file  . Food insecurity:    Worry: Not on file    Inability: Not on  file  . Transportation needs:    Medical: Not on file    Non-medical: Not on file  Tobacco Use  . Smoking status: Never Smoker  . Smokeless tobacco: Never Used  Substance and Sexual Activity  . Alcohol use: No  . Drug use: No    Types: Other-see comments    Comment: denies current use  . Sexual activity: Not on file    Comment: occassional  Lifestyle  . Physical activity:    Days per week: Not on file    Minutes per session: Not on file  . Stress: Not on file  Relationships  . Social connections:    Talks on phone: Not on file    Gets together: Not on file    Attends religious service: Not on file    Active member of club or organization: Not on file    Attends meetings of clubs or organizations: Not on file    Relationship status: Not on file  . Intimate partner violence:    Fear of current or ex partner: Not on file    Emotionally abused: Not on file    Physically abused: Not on file    Forced sexual activity: Not on file  Other Topics Concern  . Not on file  Social History Narrative  . Not on file   Outpatient Medications Prior  to Visit  Medication Sig Dispense Refill  . amLODipine (NORVASC) 5 MG tablet Take 1 tablet (5 mg total) by mouth daily. 90 tablet 1  . chlorthalidone (HYGROTON) 25 MG tablet Take by mouth.    Marland Kitchen. guaiFENesin (MUCINEX) 600 MG 12 hr tablet Take by mouth 2 (two) times daily.    . metFORMIN (GLUCOPHAGE) 500 MG tablet Take 1 tablet (500 mg total) by mouth daily with breakfast. 90 tablet 1  . rosuvastatin (CRESTOR) 40 MG tablet TAKE 1 TABLET BY MOUTH ONCE DAILY 90 tablet 0  . meloxicam (MOBIC) 7.5 MG tablet Take 1 tablet (7.5 mg total) by mouth daily. (Patient not taking: Reported on 04/09/2018) 30 tablet 0   No facility-administered medications prior to visit.     Allergies  Allergen Reactions  . Niacin Other (See Comments)    flushing    ROS Review of Systems  Constitutional: Negative.   HENT: Negative.   Eyes: Negative.   Respiratory:  Positive for shortness of breath (Occasional).   Cardiovascular: Positive for palpitations (Occasional).       Chest discomfort, intermittent.   Gastrointestinal: Negative.   Endocrine: Negative.   Genitourinary: Negative.   Musculoskeletal: Negative.   Skin: Negative.   Neurological: Positive for dizziness and headaches.  Hematological: Negative.   Psychiatric/Behavioral: Negative.    Objective:    Physical Exam  Constitutional: He is oriented to person, place, and time. He appears well-developed and well-nourished.  HENT:  Head: Normocephalic and atraumatic.  Eyes: Conjunctivae are normal.  Neck: Normal range of motion. Neck supple.  Cardiovascular: Normal rate and regular rhythm.  Pulmonary/Chest: Effort normal and breath sounds normal.  Abdominal: Soft. Bowel sounds are normal.  Musculoskeletal: Normal range of motion.  Neurological: He is alert and oriented to person, place, and time.  Skin: Skin is warm and dry.  Psychiatric: He has a normal mood and affect. His behavior is normal. Judgment and thought content normal.  Nursing note and vitals reviewed.   BP 132/64 (BP Location: Left Arm, Patient Position: Sitting, Cuff Size: Large)   Pulse 63   Temp 98.7 F (37.1 C) (Oral)   Ht 5\' 8"  (1.727 m)   Wt 206 lb 12.8 oz (93.8 kg)   SpO2 97%   BMI 31.44 kg/m  Wt Readings from Last 3 Encounters:  04/09/18 206 lb 12.8 oz (93.8 kg)  01/17/18 206 lb 6.4 oz (93.6 kg)  09/11/17 207 lb (93.9 kg)   Health Maintenance Due  Topic Date Due  . HIV Screening  03/17/1978  . TETANUS/TDAP  03/09/2008  . INFLUENZA VACCINE  10/07/2017   There are no preventive care reminders to display for this patient.  Lab Results  Component Value Date   TSH 3.730 04/28/2017   Lab Results  Component Value Date   WBC 7.7 11/14/2009   HGB 13.6 11/14/2009   HCT 40.0 11/14/2009   MCV 84.4 11/14/2009   PLT 157 11/14/2009   Lab Results  Component Value Date   NA 141 09/11/2017   K 3.7  09/11/2017   CO2 22 09/11/2017   GLUCOSE 99 09/11/2017   BUN 12 09/11/2017   CREATININE 1.02 09/11/2017   BILITOT <0.2 09/11/2017   ALKPHOS 77 09/11/2017   AST 24 09/11/2017   ALT 20 09/11/2017   PROT 7.0 09/11/2017   ALBUMIN 4.4 09/11/2017   CALCIUM 9.3 09/11/2017   Lab Results  Component Value Date   CHOL 172 01/17/2018   Lab Results  Component Value Date  HDL 41 01/17/2018   Lab Results  Component Value Date   LDLCALC 110 (H) 01/17/2018   Lab Results  Component Value Date   TRIG 103 01/17/2018   Lab Results  Component Value Date   CHOLHDL 4.2 01/17/2018   Lab Results  Component Value Date   HGBA1C 6.0 (A) 01/17/2018   Assessment & Plan:   1. Heart palpitations Abnormal ECG revealed negative T-waves. Patient has history of CAD, and chest discomfort and is referred to ED today for further assessment. Patient refused to report to ED at this time, but states that he may got to ED sometimes this weekend. Stabilized today. AMA form explained and signed by patient. Urgent Cardiology referral made today. He will follow up in 1 week.  - EKG 12-Lead - Ambulatory referral to Cardiology - nitroGLYCERIN (NITROSTAT) 0.4 MG SL tablet; Place 1 tablet (0.4 mg total) under the tongue every 5 (five) minutes as needed for chest pain.  Dispense: 50 tablet; Refill: 3  2. Chest discomfort - nitroGLYCERIN (NITROSTAT) 0.4 MG SL tablet; Place 1 tablet (0.4 mg total) under the tongue every 5 (five) minutes as needed for chest pain.  Dispense: 50 tablet; Refill: 3  3. Shortness of breath  4. Abnormal EKG - nitroGLYCERIN (NITROSTAT) 0.4 MG SL tablet; Place 1 tablet (0.4 mg total) under the tongue every 5 (five) minutes as needed for chest pain.  Dispense: 50 tablet; Refill: 3  5. Essential hypertension Blood pressure is stable at 132/64 today. She will continue Amlodipine and Chlorthalidone as prescribed. She will continue to decrease high sodium intake, excessive alcohol intake,  increase potassium intake, smoking cessation, and increase physical activity of at least 30 minutes of cardio activity daily. She will continue to follow Heart Healthy or DASH diet.  6. Pre-diabetes - Hemoglobin A1c  7. Follow up Follow up in 1 week.    Meds ordered this encounter  Medications  . nitroGLYCERIN (NITROSTAT) 0.4 MG SL tablet    Sig: Place 1 tablet (0.4 mg total) under the tongue every 5 (five) minutes as needed for chest pain.    Dispense:  50 tablet    Refill:  3   Raliegh Ip,  MSN, FNP-C Patient [facility] [address] [place], [place] [address] [phone]   Problem List Items Addressed This Visit    None    Visit Diagnoses    Heart palpitations    -  Primary   Relevant Medications   nitroGLYCERIN (NITROSTAT) 0.4 MG SL tablet   Other Relevant Orders   EKG 12-Lead (Completed)   Ambulatory referral to Cardiology   Chest discomfort       Shortness of breath       Relevant Medications   nitroGLYCERIN (NITROSTAT) 0.4 MG SL tablet   Abnormal EKG       Relevant Medications   nitroGLYCERIN (NITROSTAT) 0.4 MG SL tablet   Essential hypertension       Relevant Medications   nitroGLYCERIN (NITROSTAT) 0.4 MG SL tablet   Pre-diabetes       Relevant Orders   Hemoglobin A1c   Follow up          Meds ordered this encounter  Medications  . nitroGLYCERIN (NITROSTAT) 0.4 MG SL tablet    Sig: Place 1 tablet (0.4 mg total) under the tongue every 5 (five) minutes as needed for chest pain.    Dispense:  50 tablet    Refill:  3    Follow-up:  Return in about 1 week (around 04/16/2018).    Kallie LocksNatalie M Kennice Finnie, FNP

## 2018-04-09 NOTE — Patient Instructions (Addendum)
If you have lab work done today you will be contacted with your lab results within the next 2 weeks.  If you have not heard from us then please contact us. The fastest way to get your results is to register for My Chart.   IF you received an x-ray today, you will receive an invoice from [facility]. Please contact [facility] at [phone] with questions or concerns regarding your invoice.   IF you received labwork today, you will receive an invoice from [facility]. Please contact [facility] at [phone] with questions or concerns regarding your invoice.   Our billing staff will not be able to assist you with questions regarding bills from these companies.  You will be contacted with the lab results as soon as they are available. The fastest way to get your results is to activate your My Chart account. Instructions are located on the last page of this paperwork. If you have not heard from us regarding the results in 2 weeks, please contact this office.     Nitroglycerin sublingual tablets What is this medicine? NITROGLYCERIN (nye troe GLI ser in) is a type of vasodilator. It relaxes blood vessels, increasing the blood and oxygen supply to your heart. This medicine is used to relieve chest pain caused by angina. It is also used to prevent chest pain before activities like climbing stairs, going outdoors in cold weather, or sexual activity. This medicine may be used for other purposes; ask your health care provider or pharmacist if you have questions. COMMON BRAND NAME(S): Nitroquick, Nitrostat, Nitrotab What should I tell my health care provider before I take this medicine? They need to know if you have any of these conditions: -anemia -head injury, recent stroke, or bleeding in the brain -liver disease -previous heart attack -an unusual or allergic reaction to nitroglycerin, other medicines, foods, dyes, or preservatives -pregnant or trying to get  pregnant -breast-feeding How should I use this medicine? Take this medicine by mouth as needed. At the first sign of an angina attack (chest pain or tightness) place one tablet under your tongue. You can also take this medicine 5 to 10 minutes before an event likely to produce chest pain. Follow the directions on the prescription label. Let the tablet dissolve under the tongue. Do not swallow whole. Replace the dose if you accidentally swallow it. It will help if your mouth is not dry. Saliva around the tablet will help it to dissolve more quickly. Do not eat or drink, smoke or chew tobacco while a tablet is dissolving. If you are not better within 5 minutes after taking ONE dose of nitroglycerin, call 9-1-1 immediately to seek emergency medical care. Do not take more than 3 nitroglycerin tablets over 15 minutes. If you take this medicine often to relieve symptoms of angina, your doctor or health care professional may provide you with different instructions to manage your symptoms. If symptoms do not go away after following these instructions, it is important to call 9-1-1 immediately. Do not take more than 3 nitroglycerin tablets over 15 minutes. Talk to your pediatrician regarding the use of this medicine in children. Special care may be needed. Overdosage: If you think you have taken too much of this medicine contact a poison control center or emergency room at once. NOTE: This medicine is only for you. Do not share this medicine with others. What if I miss a dose? This does not apply. This medicine is only used as needed. What may interact  with this medicine? Do not take this medicine with any of the following medications: -certain migraine medicines like ergotamine and dihydroergotamine (DHE) -medicines used to treat erectile dysfunction like sildenafil, tadalafil, and vardenafil -riociguat This medicine may also interact with the following medications: -alteplase -aspirin -heparin -medicines  for high blood pressure -medicines for mental depression -other medicines used to treat angina -phenothiazines like chlorpromazine, mesoridazine, prochlorperazine, thioridazine This list may not describe all possible interactions. Give your health care provider a list of all the medicines, herbs, non-prescription drugs, or dietary supplements you use. Also tell them if you smoke, drink alcohol, or use illegal drugs. Some items may interact with your medicine. What should I watch for while using this medicine? Tell your doctor or health care professional if you feel your medicine is no longer working. Keep this medicine with you at all times. Sit or lie down when you take your medicine to prevent falling if you feel dizzy or faint after using it. Try to remain calm. This will help you to feel better faster. If you feel dizzy, take several deep breaths and lie down with your feet propped up, or bend forward with your head resting between your knees. You may get drowsy or dizzy. Do not drive, use machinery, or do anything that needs mental alertness until you know how this drug affects you. Do not stand or sit up quickly, especially if you are an older patient. This reduces the risk of dizzy or fainting spells. Alcohol can make you more drowsy and dizzy. Avoid alcoholic drinks. Do not treat yourself for coughs, colds, or pain while you are taking this medicine without asking your doctor or health care professional for advice. Some ingredients may increase your blood pressure. What side effects may I notice from receiving this medicine? Side effects that you should report to your doctor or health care professional as soon as possible: -blurred vision -dry mouth -skin rash -sweating -the feeling of extreme pressure in the head -unusually weak or tired Side effects that usually do not require medical attention (report to your doctor or health care professional if they continue or are  bothersome): -flushing of the face or neck -headache -irregular heartbeat, palpitations -nausea, vomiting This list may not describe all possible side effects. Call your doctor for medical advice about side effects. You may report side effects to FDA at 1-800-FDA-1088. Where should I keep my medicine? Keep out of the reach of children. Store at room temperature between 20 and 25 degrees C (68 and 77 degrees F). Store in Retail buyer. Protect from light and moisture. Keep tightly closed. Throw away any unused medicine after the expiration date. NOTE: This sheet is a summary. It may not cover all possible information. If you have questions about this medicine, talk to your doctor, pharmacist, or health care provider.  2019 Elsevier/Gold Standard (2012-12-22 17:57:36)  Palpitations Palpitations are feelings that your heartbeat is not normal. Your heartbeat may feel like it is:  Uneven.  Faster than normal.  Fluttering.  Skipping a beat. This is usually not a serious problem. In some cases, you may need tests to rule out any serious problems. Follow these instructions at home: Pay attention to any changes in your condition. Take these actions to help manage your symptoms: Eating and drinking  Avoid: ? Coffee, tea, soft drinks, and energy drinks. ? Chocolate. ? Alcohol. ? Diet pills. Lifestyle   Try to lower your stress. These things can help you relax: ? Yoga. ? Deep  breathing and meditation. ? Exercise. ? Using words and images to create positive thoughts (guided imagery). ? Using your mind to control things in your body (biofeedback).  Do not use drugs.  Get plenty of rest and sleep. Keep a regular bed time. General instructions   Take over-the-counter and prescription medicines only as told by your doctor.  Do not use any products that contain nicotine or tobacco, such as cigarettes and e-cigarettes. If you need help quitting, ask your doctor.  Keep all  follow-up visits as told by your doctor. This is important. You may need more tests if palpitations do not go away or get worse. Contact a doctor if:  Your symptoms last more than 24 hours.  Your symptoms occur more often. Get help right away if you:  Have chest pain.  Feel short of breath.  Have a very bad headache.  Feel dizzy.  Pass out (faint). Summary  Palpitations are feelings that your heartbeat is uneven or faster than normal. It may feel like your heart is fluttering or skipping a beat.  Avoid food and drinks that may cause palpitations. These include caffeine, chocolate, and alcohol.  Try to lower your stress. Do not smoke or use drugs.  Get help right away if you faint or have chest pain, shortness of breath, a severe headache, or dizziness. This information is not intended to replace advice given to you by your health care provider. Make sure you discuss any questions you have with your health care provider. Document Released: 12/03/2007 Document Revised: 04/07/2017 Document Reviewed: 04/07/2017 Elsevier Interactive Patient Education  2019 [employer]

## 2018-04-11 ENCOUNTER — Other Ambulatory Visit: Payer: Self-pay

## 2018-04-11 ENCOUNTER — Telehealth: Payer: Self-pay | Admitting: Family Medicine

## 2018-04-11 ENCOUNTER — Encounter (HOSPITAL_BASED_OUTPATIENT_CLINIC_OR_DEPARTMENT_OTHER): Payer: Self-pay | Admitting: Emergency Medicine

## 2018-04-11 ENCOUNTER — Ambulatory Visit: Payer: Self-pay | Admitting: *Deleted

## 2018-04-11 ENCOUNTER — Emergency Department (HOSPITAL_BASED_OUTPATIENT_CLINIC_OR_DEPARTMENT_OTHER): Payer: BLUE CROSS/BLUE SHIELD

## 2018-04-11 ENCOUNTER — Emergency Department (HOSPITAL_BASED_OUTPATIENT_CLINIC_OR_DEPARTMENT_OTHER)
Admission: EM | Admit: 2018-04-11 | Discharge: 2018-04-11 | Disposition: A | Discharge status: Home / Self Care | Payer: BLUE CROSS/BLUE SHIELD | Attending: Emergency Medicine | Admitting: Emergency Medicine

## 2018-04-11 DIAGNOSIS — I10 Essential (primary) hypertension: Secondary | ICD-10-CM | POA: Diagnosis not present

## 2018-04-11 DIAGNOSIS — R002 Palpitations: Secondary | ICD-10-CM | POA: Insufficient documentation

## 2018-04-11 DIAGNOSIS — R42 Dizziness and giddiness: Secondary | ICD-10-CM | POA: Diagnosis present

## 2018-04-11 DIAGNOSIS — E876 Hypokalemia: Secondary | ICD-10-CM | POA: Diagnosis not present

## 2018-04-11 LAB — COMPREHENSIVE METABOLIC PANEL
ALT: 32 U/L (ref 0–44)
AST: 31 U/L (ref 15–41)
Albumin: 4.9 g/dL (ref 3.5–5.0)
Alkaline Phosphatase: 67 U/L (ref 38–126)
Anion gap: 7 (ref 5–15)
BUN: 18 mg/dL (ref 6–20)
CHLORIDE: 98 mmol/L (ref 98–111)
CO2: 29 mmol/L (ref 22–32)
Calcium: 9.8 mg/dL (ref 8.9–10.3)
Creatinine, Ser: 1.1 mg/dL (ref 0.61–1.24)
GFR calc Af Amer: >60 mL/min (ref >60)
Glucose, Bld: 110 mg/dL — ABNORMAL HIGH (ref 70–99)
Potassium: 3.2 mmol/L — ABNORMAL LOW (ref 3.5–5.1)
Sodium: 134 mmol/L — ABNORMAL LOW (ref 135–145)
Total Bilirubin: 0.8 mg/dL (ref 0.3–1.2)
Total Protein: 8.2 g/dL — ABNORMAL HIGH (ref 6.5–8.1)

## 2018-04-11 LAB — CBC WITH DIFFERENTIAL/PLATELET
Abs Immature Granulocytes: 0.01 10*3/uL (ref 0.00–0.07)
Basophils Absolute: 0 10*3/uL (ref 0.0–0.1)
Basophils Relative: 1 %
Eosinophils Absolute: 0.1 10*3/uL (ref 0.0–0.5)
Eosinophils Relative: 1 %
HCT: 46 % (ref 39.0–52.0)
Hemoglobin: 14.7 g/dL (ref 13.0–17.0)
IMMATURE GRANULOCYTES: 0 %
Lymphocytes Relative: 27 %
Lymphs Abs: 1.6 10*3/uL (ref 0.7–4.0)
MCH: 28.1 pg (ref 26.0–34.0)
MCHC: 32 g/dL (ref 30.0–36.0)
MCV: 87.8 fL (ref 80.0–100.0)
MONOS PCT: 7 %
Monocytes Absolute: 0.4 10*3/uL (ref 0.1–1.0)
NEUTROS ABS: 3.7 10*3/uL (ref 1.7–7.7)
NEUTROS PCT: 64 %
Platelets: 175 10*3/uL (ref 150–400)
RBC: 5.24 MIL/uL (ref 4.22–5.81)
RDW: 13.3 % (ref 11.5–15.5)
WBC: 5.8 10*3/uL (ref 4.0–10.5)
nRBC: 0 % (ref 0.0–0.2)

## 2018-04-11 LAB — TSH: TSH: 1.467 u[IU]/mL (ref 0.350–4.500)

## 2018-04-11 LAB — TROPONIN I: Troponin I: <0.03 ng/mL (ref <0.03)

## 2018-04-11 LAB — HEMOGLOBIN A1C
Est. average glucose Bld gHb Est-mCnc: 131 mg/dL
Hgb A1c MFr Bld: 6.2 % — ABNORMAL HIGH (ref 4.8–5.6)

## 2018-04-11 LAB — MAGNESIUM: Magnesium: 2.4 mg/dL (ref 1.7–2.4)

## 2018-04-11 LAB — D-DIMER, QUANTITATIVE: D-Dimer, Quant: <0.27 ug/mL-FEU (ref 0.00–0.50)

## 2018-04-11 LAB — BRAIN NATRIURETIC PEPTIDE: B Natriuretic Peptide: 11.6 pg/mL (ref 0.0–100.0)

## 2018-04-11 MED ORDER — MAGNESIUM SULFATE 2 GM/50ML IV SOLN
2.0000 g | Freq: Once | INTRAVENOUS | Status: AC
  Administered 2018-04-11: 2 g via INTRAVENOUS
  Filled 2018-04-11: qty 50

## 2018-04-11 MED ORDER — POTASSIUM CHLORIDE CRYS ER 20 MEQ PO TBCR
40.0000 meq | EXTENDED_RELEASE_TABLET | Freq: Once | ORAL | Status: AC
  Administered 2018-04-11: 40 meq via ORAL
  Filled 2018-04-11: qty 2

## 2018-04-11 MED ORDER — POTASSIUM CHLORIDE 10 MEQ/100ML IV SOLN
10.0000 meq | Freq: Once | INTRAVENOUS | Status: AC
  Administered 2018-04-11: 10 meq via INTRAVENOUS
  Filled 2018-04-11: qty 100

## 2018-04-11 MED ORDER — LACTATED RINGERS IV BOLUS
1000.0000 mL | Freq: Once | INTRAVENOUS | Status: AC
  Administered 2018-04-11: 1000 mL via INTRAVENOUS

## 2018-04-11 MED ORDER — POTASSIUM CHLORIDE CRYS ER 20 MEQ PO TBCR: 40.0000 meq | EXTENDED_RELEASE_TABLET | Freq: Every day | ORAL | 0 refills | Status: AC

## 2018-04-11 NOTE — ED Triage Notes (Signed)
Pt reports feeling a flutter in his chest on Saturday. He had an ekg done and was told there was some abnormality and to follow up with PCP. Pt reports PTA experiencing dizziness and diaphoresis. Denis chest pain or SOB.

## 2018-04-11 NOTE — ED Provider Notes (Signed)
Emergency Department Provider Note   I have reviewed the triage vital signs and the nursing notes.   HISTORY  Chief Complaint Dizziness   HPI [name] is a 53 y.o. male with a history of hypertension, hyperlipidemia and prediabetes presents the emergency department today secondary to palpitations.  Patient has had episodic palpitations at rest mostly at night sometimes associate with anxiety for the last couple weeks.  Went to follow-up with his primary doctor last week with a did EKG which looked abnormal and referred him to the emergency room.  Today he had one episode of feeling lightheaded and dizzy like he was going to pass out.  This was not related to exertion.  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  Patient never has any chest pain.  Never has any symptoms on exertion.  He had a stress test in 2018 that was normal.  Has longstanding hypertension which is been better controlled with medications.  No syncope or shortness of breath.  No recent illnesses.  No GI symptoms.  He does smoke marijuana but is unsure if this is related to that or not.  Does have anxiety as well and is anxious with these episodes sometimes. No other associated or modifying symptoms.    Past Medical History:  Diagnosis Date  . Cervical spine degeneration    5-6 narrowing of cervical spine  . Hyperlipidemia   . Hypertension     Patient Active Problem List   Diagnosis Date Noted  . Heart palpitations 04/09/2018  . Chest discomfort 04/09/2018  . Shortness of breath 04/09/2018  . Abnormal EKG 04/09/2018  . MUSCLE CRAMPS 07/05/2008  . HYPERLIPIDEMIA NEC/NOS 08/13/2006  . HYPERTENSION, ESSENTIAL NOS 08/13/2006  . GERD 08/13/2006  . DEGENERATION, CERVICAL DISC 08/13/2006    Past Surgical History:  Procedure Laterality Date  . COLONOSCOPY    . KNEE ARTHROSCOPY     right    Current Outpatient Rx  . Order  #: [id number] Class: Normal  . Order #: [id number] Class: Historical Med  . Order #: [id number] Class: Historical Med  . Order #: [id number] Class: Normal  . Order #: [id number] Class: Normal  . Order #: [id number] Class: Normal  . Order #: [id number] Class: Normal  . Order #: [id number] Class: Normal    Allergies Niacin  Family History  Problem Relation Age of Onset  . Diabetes Mother   . Hypertension Mother   . Hyperlipidemia Mother   . Diabetes Father   . Hypertension Father   . Hyperlipidemia Sister   . Hypertension Brother   . Diabetes Brother     Social History Social History   Tobacco Use  . Smoking status: Never Smoker  . Smokeless tobacco: Never Used  Substance Use Topics  . Alcohol use: No  . Drug use: Yes    Types: Other-see comments, Marijuana    Comment: denies current use    Review of Systems  All other systems negative except as documented in the HPI. All pertinent positives and negatives as reviewed in the HPI. ____________________________________________   PHYSICAL EXAM:  VITAL SIGNS: ED Triage Vitals  Enc Vitals Group     BP 04/11/18 1502 136/81     Pulse Rate 04/11/18 1502 61     Resp 04/11/18 1502 16     Temp 04/11/18 1502 98.4 F (36.9 C)     Temp Source 04/11/18 1502 Oral     SpO2 04/11/18 1502 99 %  Weight --     Constitutional: Alert and oriented. Well appearing and in no acute distress. Eyes: Conjunctivae are normal. PERRL. EOMI. Head: Atraumatic. Nose: No congestion/rhinnorhea. Mouth/Throat: Mucous membranes are moist.  Oropharynx non-erythematous. Neck: No stridor.  No meningeal signs.   Cardiovascular: Normal rate, regular rhythm. Good peripheral circulation. Grossly normal heart sounds.   Respiratory: Normal respiratory effort.  No retractions. Lungs diminished bilaterally.  Gastrointestinal: Soft and nontender. No distention.  Musculoskeletal: No lower extremity tenderness nor edema. No gross deformities of extremities. Neurologic:   Normal speech and language. No gross focal neurologic deficits are appreciated.  Skin:  Skin is warm, dry and intact. No rash noted.   ____________________________________________   LABS (all labs ordered are listed, but only abnormal results are displayed)  Labs Reviewed  COMPREHENSIVE METABOLIC PANEL - Abnormal; Notable for the following components:      Result Value   Sodium 134 (*)    Potassium 3.2 (*)    Glucose, Bld 110 (*)    Total Protein 8.2 (*)    All other components within normal limits  CBC WITH DIFFERENTIAL/PLATELET  MAGNESIUM  TROPONIN I  BRAIN NATRIURETIC PEPTIDE  D-DIMER, QUANTITATIVE (NOT AT [facility])  TSH   ____________________________________________  EKG   EKG Interpretation  Date/Time:  Monday April 11 2018 15:01:13 EST Ventricular Rate:  64 PR Interval:  166 QRS Duration: 90 QT Interval:  404 QTC Calculation: 416 R Axis:   79 Text Interpretation:  Normal sinus rhythm Minimal voltage criteria for LVH, may be normal variant T wave abnormality, consider inferolateral ischemia Abnormal ECG New TW abnormalities in inferiolateral leads, slight elevation laterally but less than 27mm Confirmed by Alvira Monday ([id number]) on 04/11/2018 3:10:46 PM       ____________________________________________  RADIOLOGY  Dg Chest 2 View  Result Date: 04/11/2018 CLINICAL DATA:  Palpitations.  Abnormal EKG. EXAM: CHEST - 2 VIEW COMPARISON:  November 13, 2009 FINDINGS: The heart size and mediastinal contours are within normal limits. Both lungs are clear. The visualized skeletal structures are unremarkable. IMPRESSION: No active cardiopulmonary disease. Electronically Signed   By: Gerome Sam III M.D   On: 04/11/2018 16:03    ____________________________________________   PROCEDURES  Procedure(s) performed:   Procedures   ____________________________________________   INITIAL IMPRESSION / ASSESSMENT AND PLAN / ED COURSE  Suspect ecg changes are related  to lead placement and LVH changes. Discussed with cardiology (Dr. Jacques Navy) who reviewed ecg's, history and did not think this was ACS related and agreed with plan and close follow up with monitor and echo (already scheduled in a few weeks).   D-dimer negative, troponin negative patient asymptomatic and stable for discharge.  Was found to hypokalemic which may be related not hypomagnesemia but was treated for the same. Return precautions provided. Cardiology already scheduled. Stable for dc.   Pertinent labs & imaging results that were available during my care of the patient were reviewed by me and considered in my medical decision making (see chart for details).  ____________________________________________  FINAL CLINICAL IMPRESSION(S) / ED DIAGNOSES  Final diagnoses:  Hypokalemia  Palpitations    MEDICATIONS GIVEN DURING THIS VISIT:  Medications  lactated ringers bolus 1,000 mL ( Intravenous Stopped 04/11/18 1719)  magnesium sulfate IVPB 2 g 50 mL ( Intravenous Stopped 04/11/18 1820)  potassium chloride SA (K-DUR,KLOR-CON) CR tablet 40 mEq (40 mEq Oral Given 04/11/18 1713)  potassium chloride 10 mEq in 100 mL IVPB ( Intravenous Stopped 04/11/18 1724)     NEW OUTPATIENT MEDICATIONS  STARTED DURING THIS VISIT:  New Prescriptions   POTASSIUM CHLORIDE SA (K-DUR,KLOR-CON) 20 MEQ TABLET    Take 2 tablets (40 mEq total) by mouth daily for 7 days.    Note:  This note was prepared with assistance of Dragon voice recognition software. Occasional wrong-word or sound-a-like substitutions may have occurred due to the inherent limitations of voice recognition software.   Marily Memos, MD 04/11/18 1911

## 2018-04-11 NOTE — Telephone Encounter (Signed)
Copied from CRM [id number]. Topic: Quick Communication - See Telephone Encounter >> Apr 11, 2018  8:14 AM Jay Schlichter wrote: CRM for notification. See Telephone encounter for: 04/11/18. Pt called as follow up on his 02/01/ appt.  He says he needs was told to go to ED to get testing done, but declined.  He is now asking what testing needs to be done and would like to have it taken care of today.  He is going out of town tomorrow. Please call [phone]

## 2018-04-11 NOTE — ED Notes (Signed)
ED Provider at bedside. 

## 2018-04-11 NOTE — Telephone Encounter (Signed)
Pt seen Saturday "Palpitations." Abnormal EKG. Pt was told to go to ED for further evaluation, did not do so at that time. Calling now to report dizziness today, not presently. States episode lasted 5 minutes, along with diaphoresis; was working outside. Denies CP, SOB. Pt states he may not have been drinking enough. States he had palpitations last night, "None today." States he is in parking lot of [employer]. Advised pt to go to ED for further evaluation as advised by N. Stroud Saturday. Wife present during call; states they will go to ED. See TE of today...1354  Reason for Disposition . Extra heart beats OR irregular heart beating  (i.e., "palpitations")  Answer Assessment - Initial Assessment Questions 1. DESCRIPTION: "Describe your dizziness."   Lightheaded 2. LIGHTHEADED: "Do you feel lightheaded?" (e.g., somewhat faint, woozy, weak upon standing)     Yes 3. VERTIGO: "Do you feel like either you or the room is spinning or tilting?" (i.e. vertigo)     no 4. SEVERITY: "How bad is it?"  "Do you feel like you are going to faint?" "Can you stand and walk?"   - MILD - walking normally   - MODERATE - interferes with normal activities (e.g., work, school)    - SEVERE - unable to stand, requires support to walk, feels like passing out now.      Severe for 5 minutes 5. ONSET:  "When did the dizziness begin?"     Just prior to NT call 6. AGGRAVATING FACTORS: "Does anything make it worse?" (e.g., standing, change in head position)     "Outside working" 7. HEART RATE: "Can you tell me your heart rate?" "How many beats in 15 seconds?"  (Note: not all patients can do this)       no 8. CAUSE: "What do you think is causing the dizziness?"     Unsure 9. RECURRENT SYMPTOM: "Have you had dizziness before?" If so, ask: "When was the last time?" "What happened that time?"     no 10. OTHER SYMPTOMS: "Do you have any other symptoms?" (e.g., fever, chest pain, vomiting, diarrhea, bleeding)       Seen  Saturday for palpitations by Jolene Schimke. Abnormal EKG. Diaphoresis with dizzy episode today  Protocols used: DIZZINESS [facility]

## 2018-04-11 NOTE — Telephone Encounter (Signed)
Patient called to say that he contacted the Cardiology clinic at [facility] but they will not be able to see him until 05/06/2018. So he said that he was feeling dizzier than ever before so he was on the way to the hospital to be checked out. Also stated that he is leaving out of town on 04/12/2018 and would not be back until after 04/22/2018. He wanted to inform Dr Creta Levin on what was happening.

## 2018-04-12 NOTE — Telephone Encounter (Signed)
FYI

## 2018-04-15 ENCOUNTER — Encounter: Payer: Self-pay | Admitting: Family Medicine

## 2018-04-25 ENCOUNTER — Ambulatory Visit: Payer: BLUE CROSS/BLUE SHIELD | Admitting: Family Medicine

## 2018-04-26 ENCOUNTER — Other Ambulatory Visit: Payer: Self-pay | Admitting: Family Medicine

## 2018-04-26 NOTE — Telephone Encounter (Signed)
Requested Prescriptions  Pending Prescriptions Disp Refills  . metFORMIN (GLUCOPHAGE) 500 MG tablet [Pharmacy Med Name: METFORMIN '500MG'$  TABLETS] 90 tablet 0    Sig: TAKE 1 TABLET BY MOUTH ONCE DAILY WITH BREAKFAST     Endocrinology:  Diabetes - Biguanides Passed - 04/26/2018  3:51 PM      Passed - Cr in normal range and within 360 days    Creatinine, Ser  Date Value Ref Range Status  04/11/2018 1.10 0.61 - 1.24 mg/dL Final         Passed - HBA1C is between 0 and 7.9 and within 180 days    Hgb A1c MFr Bld  Date Value Ref Range Status  04/09/2018 6.2 (H) 4.8 - 5.6 % Final    Comment:             Prediabetes: 5.7 - 6.4          Diabetes: >6.4          Glycemic control for adults with diabetes: <7.0          Passed - eGFR in normal range and within 360 days    GFR calc Af Amer  Date Value Ref Range Status  04/11/2018 >60 >60 mL/min Final   GFR calc non Af Amer  Date Value Ref Range Status  04/11/2018 >60 >60 mL/min Final         Passed - Valid encounter within last 6 months    Recent Outpatient Visits          2 weeks ago Heart palpitations   Primary Care at [facility], Ellie Lunch, FNP   3 months ago Prediabetes   Primary Care at [facility], Arlie Solomons, MD   7 months ago Essential hypertension   Primary Care at [facility], Arlie Solomons, MD   12 months ago Encounter for health maintenance examination in adult   Primary Care at [place], MD      Future Appointments            In 1 month Forrest Moron, MD Primary Care at [place], [facility]   In 1 month Forrest Moron, MD Primary Care at [place], [facility]

## 2018-04-28 ENCOUNTER — Telehealth: Payer: Self-pay | Admitting: Family Medicine

## 2018-04-28 ENCOUNTER — Ambulatory Visit: Payer: 59 | Admitting: Cardiology

## 2018-04-28 NOTE — Telephone Encounter (Signed)
Tried to call patient and inform him of his appnt with [facility] cardiology on 06/07/18 @ 10:00am  [id number] [place] if he has problems with this appnt he can call [phone].  Provider will be sending out paperwork   04/28/2018

## 2018-05-02 ENCOUNTER — Encounter: Payer: Self-pay | Admitting: Family Medicine

## 2018-05-08 ENCOUNTER — Encounter

## 2018-05-09 ENCOUNTER — Other Ambulatory Visit: Payer: Self-pay | Admitting: Family Medicine

## 2018-05-09 NOTE — Telephone Encounter (Signed)
Copied from CRM [id number]. Topic: Quick Communication - Rx Refill/Question >> May 09, 2018 12:22 PM Maia Petties wrote: Medication: chlorthalidone (HYGROTON) 25 MG tablet - pt is out of this medication and states the pharmacy requested 04/28/2018 - needs 90 day supply  tadalafil (CIALIS) 5 MG tablet - pt stating recently he began having male issues again and needing refilled  Has the patient contacted their pharmacy? Yes - said they sent request Preferred Pharmacy (with phone number or street name): [id number] [place], [place] - [phone] AT [facility] [address] [phone] (Phone) [phone] (Fax)

## 2018-05-10 NOTE — Telephone Encounter (Signed)
Pt requesting tadalafil (Cialis) 5 mg tab. Not on med list. See telephone encounter.

## 2018-05-10 NOTE — Telephone Encounter (Signed)
1.) Refill request for cialis; medication not on pt medication list; contacted pt and he states that he previously had a prescription but never used it; he would like a new prescription sent to [facility]; the pt can be contacted at [phone], and a message can be left; will route to office for final disposition.  2.) Requested medication (s) are due for refill today: chlorthalidone ?  Requested medication (s) are on the active medication list: yes  Last refill:  ?  Future visit scheduled: yes  Notes to clinic:  Historical provider; Please provide prescription sign.     Requested Prescriptions  Pending Prescriptions Disp Refills  . chlorthalidone (HYGROTON) 25 MG tablet      Sig: Take by mouth.     Cardiovascular: Diuretics - Thiazide Failed - 05/09/2018 12:51 PM      Failed - K in normal range and within 360 days    Potassium  Date Value Ref Range Status  04/11/2018 3.2 (L) 3.5 - 5.1 mmol/L Final         Failed - Na in normal range and within 360 days    Sodium  Date Value Ref Range Status  04/11/2018 134 (L) 135 - 145 mmol/L Final  09/11/2017 141 134 - 144 mmol/L Final         Passed - Ca in normal range and within 360 days    Calcium  Date Value Ref Range Status  04/11/2018 9.8 8.9 - 10.3 mg/dL Final         Passed - Cr in normal range and within 360 days    Creatinine, Ser  Date Value Ref Range Status  04/11/2018 1.10 0.61 - 1.24 mg/dL Final         Passed - Last BP in normal range    BP Readings from Last 1 Encounters:  04/11/18 118/71         Passed - Valid encounter within last 6 months    Recent Outpatient Visits          1 month ago Heart palpitations   Primary Care at [facility], Rolm Gala, FNP   3 months ago Prediabetes   Primary Care at [facility], Manus Rudd, MD   8 months ago Essential hypertension   Primary Care at [facility], Manus Rudd, MD   1 year ago Encounter for health maintenance examination in adult   Primary Care at [facility], Manus Rudd, MD      Future Appointments            In 2 weeks Doristine Bosworth, MD Primary Care at [place], [facility]   In 1 month Doristine Bosworth, MD Primary Care at [place], [facility]

## 2018-05-10 NOTE — Telephone Encounter (Signed)
Pt requesting tadalafil (Cialis) 5 mg tab. Not on med list. See telephone encounter.  LOV: 04/09/2018  Next  Office visit 05/27/2018 and 06/23/2018

## 2018-05-11 ENCOUNTER — Other Ambulatory Visit: Payer: Self-pay | Admitting: Family Medicine

## 2018-05-11 MED ORDER — AMLODIPINE BESYLATE 5 MG PO TABS: 5.0000 mg | ORAL_TABLET | Freq: Every day | ORAL | 1 refills | Status: AC

## 2018-05-11 NOTE — Telephone Encounter (Signed)
Requested Prescriptions  Pending Prescriptions Disp Refills  . amLODipine (NORVASC) 5 MG tablet 90 tablet 1    Sig: Take 1 tablet (5 mg total) by mouth daily.     Cardiovascular:  Calcium Channel Blockers Passed - 05/11/2018 12:15 PM      Passed - Last BP in normal range    BP Readings from Last 1 Encounters:  04/11/18 118/71         Passed - Valid encounter within last 6 months    Recent Outpatient Visits          1 month ago Heart palpitations   Primary Care at [facility], Rolm Gala, FNP   3 months ago Prediabetes   Primary Care at [facility], Manus Rudd, MD   8 months ago Essential hypertension   Primary Care at [facility], Manus Rudd, MD   1 year ago Encounter for health maintenance examination in adult   Primary Care at [facility], Manus Rudd, MD      Future Appointments            In 2 weeks Doristine Bosworth, MD Primary Care at [place], [facility]   In 1 month Doristine Bosworth, MD Primary Care at [place], [facility]

## 2018-05-11 NOTE — Telephone Encounter (Signed)
Copied from CRM [id number]. Topic: Quick Communication - Rx Refill/Question >> May 11, 2018 12:13 PM Lyn Hollingshead, [facility] wrote: Medication:  amLODipine (NORVASC) 5 MG tablet   Has the patient contacted their pharmacy? Yes - stated he needed new refill (Agent: If no, request that the patient contact the pharmacy for the refill.) (Agent: If yes, when and what did the pharmacy advise?)  Preferred Pharmacy (with phone number or street name): [id number] [place], [place] - [phone] [address] AT [facility] [address] & [address] [phone] (Phone) [phone] (Fax)  Agent: Please be advised that RX refills may take up to 3 business days. We ask that you follow-up with your pharmacy.

## 2018-05-11 NOTE — Telephone Encounter (Signed)
Pt calling to find out if medication will be refilled.

## 2018-05-12 NOTE — Telephone Encounter (Signed)
Pt states he go confused and it is the hydrochlorothiazide (HYDRODIURIL) 25 MG tablet  He needs this instead    30 day  He will speak to Dr Creta Levin at his next appt about the other med  [id number] [place], [place] - [phone] AT [facility] [address] [facility] [address] [phone] (Phone) [phone] (Fax)

## 2018-05-12 NOTE — Telephone Encounter (Signed)
Pt would like Hydrochlorothiazide and tadalafil (not on medication list)  Pt has up coming appointment 05/27/2018  See message below: advise/Refill

## 2018-05-12 NOTE — Telephone Encounter (Signed)
Pt wants to know if the dr can fill this for him?  tadalafil (CIALIS) 5 MG tablet  [facility] [phone] - [place], [place] - [phone] AT [facility] [address] & [address] [phone] (Phone) [phone] (Fax)

## 2018-05-12 NOTE — Telephone Encounter (Signed)
I called pt and left vm that medication is not on there active list and if pt need this medication that he can discuss at his up coming appt on 05/27/2018

## 2018-05-12 NOTE — Telephone Encounter (Signed)
Pt states that he spoke with Dr. Creta Levin about going back on the medication awhile back and she was going to prescribe this for him. He also states that the refill for chlorthalidone (HYGROTON) 25 MG tablet  was not sent in and he is needing this as well.

## 2018-05-13 MED ORDER — CHLORTHALIDONE 25 MG PO TABS: 25.0000 mg | ORAL_TABLET | Freq: Every day | ORAL | 0 refills | Status: AC

## 2018-05-26 ENCOUNTER — Telehealth: Payer: Self-pay | Admitting: Family Medicine

## 2018-05-26 DIAGNOSIS — E876 Hypokalemia: Secondary | ICD-10-CM

## 2018-05-26 DIAGNOSIS — I10 Essential (primary) hypertension: Secondary | ICD-10-CM

## 2018-05-26 NOTE — Telephone Encounter (Signed)
Called pt to let him know we are out of network  Pt needs to see [facility] Provider. Pt should call back and let us know if he wants to keep and pay out of pocket . Please verify insurance .

## 2018-05-27 ENCOUNTER — Ambulatory Visit: Payer: BLUE CROSS/BLUE SHIELD | Admitting: Family Medicine

## 2018-05-30 ENCOUNTER — Encounter: Payer: Self-pay | Admitting: Family Medicine

## 2018-05-30 ENCOUNTER — Ambulatory Visit: Payer: BLUE CROSS/BLUE SHIELD | Admitting: Family Medicine

## 2018-05-30 MED ORDER — POTASSIUM CHLORIDE 20 MEQ PO PACK: 20.0000 meq | PACK | Freq: Every day | ORAL | 11 refills | Status: AC

## 2018-05-30 MED ORDER — TADALAFIL 10 MG PO TABS: 10.0000 mg | ORAL_TABLET | ORAL | 11 refills | Status: AC | PRN

## 2018-05-30 NOTE — Telephone Encounter (Signed)
Called patient to discuss his medication question  Should he be on chlorthalidone or HCTZ? Discussed that his bp is controlled with the chlorthalidone but he needs to take potassium with it to help with his hypokalemia  He also had concern about his erectile dysfunction? Discussed that his medications can lead to this as a side effect.  He should consider medication like cialis. He cannot combine it with nitrates as this can life-threatening hypotension. Discussed common side effects including headache, priapism and runny nose.  Will advise a half pill each dose. Discussed dosing as every 48-72 hours.

## 2018-06-03 ENCOUNTER — Telehealth: Payer: Self-pay | Admitting: Family Medicine

## 2018-06-03 NOTE — Telephone Encounter (Signed)
Copied from CRM [id number]. Topic: General - Inquiry >> Jun 02, 2018  2:36 PM Reggie Pile, NT wrote: Reason for CRM:  Patient called and left voicemail expressing his concern of switching his prescription for potassium chloride (KLOR-CON) 20 MEQ packet into a tablet form due to it being cheaper in his pharmacy. Patient is also requesting to have samples for his tadalafil (CIALIS) 10 MG tablet due to it being too expensive for him.

## 2018-06-04 ENCOUNTER — Other Ambulatory Visit: Payer: Self-pay

## 2018-06-04 MED ORDER — POTASSIUM CHLORIDE CRYS ER 20 MEQ PO TBCR: 20.0000 meq | EXTENDED_RELEASE_TABLET | Freq: Every day | ORAL | 11 refills | Status: AC

## 2018-06-04 NOTE — Telephone Encounter (Signed)
Sent Rx to pharmacy  

## 2018-06-17 ENCOUNTER — Other Ambulatory Visit: Payer: Self-pay

## 2018-06-17 ENCOUNTER — Ambulatory Visit: Payer: BLUE CROSS/BLUE SHIELD

## 2018-06-23 ENCOUNTER — Encounter: Payer: BLUE CROSS/BLUE SHIELD | Admitting: Family Medicine

## 2018-07-07 ENCOUNTER — Encounter: Payer: Self-pay | Admitting: Family Medicine

## 2018-08-16 NOTE — Telephone Encounter (Signed)
done

## 2018-09-04 ENCOUNTER — Other Ambulatory Visit: Payer: Self-pay | Admitting: Family Medicine

## 2018-09-04 NOTE — Telephone Encounter (Signed)
Requested Prescriptions  Pending Prescriptions Disp Refills  . rosuvastatin (CRESTOR) 40 MG tablet [Pharmacy Med Name: ROSUVASTATIN 40MG  TABLETS] 90 tablet 0    Sig: TAKE 1 TABLET BY MOUTH ONCE DAILY     Cardiovascular:  Antilipid - Statins Failed - 09/04/2018  3:02 PM      Failed - LDL in normal range and within 360 days    LDL Calculated  Date Value Ref Range Status  01/17/2018 110 (H) 0 - 99 mg/dL Final         Passed - Total Cholesterol in normal range and within 360 days    Cholesterol, Total  Date Value Ref Range Status  01/17/2018 172 100 - 199 mg/dL Final         Passed - HDL in normal range and within 360 days    HDL  Date Value Ref Range Status  01/17/2018 41 >39 mg/dL Final         Passed - Triglycerides in normal range and within 360 days    Triglycerides  Date Value Ref Range Status  01/17/2018 103 0 - 149 mg/dL Final         Passed - Patient is not pregnant      Passed - Valid encounter within last 12 months    Recent Outpatient Visits          4 months ago Heart palpitations   Primary Care at [facility], Ellie Lunch, FNP   7 months ago Prediabetes   Primary Care at [facility], Arlie Solomons, MD   11 months ago Essential hypertension   Primary Care at [facility], Arlie Solomons, MD   1 year ago Encounter for health maintenance examination in adult   Primary Care at [facility], Arlie Solomons, MD

## 2019-02-24 ENCOUNTER — Other Ambulatory Visit: Payer: Self-pay | Admitting: Family Medicine

## 2019-03-08 ENCOUNTER — Other Ambulatory Visit: Payer: Self-pay | Admitting: Family Medicine

## 2019-03-08 NOTE — Telephone Encounter (Signed)
Please schedule patient appt for refills 

## 2019-03-09 NOTE — Telephone Encounter (Signed)
Called pt 12.31.2020 unable to leave VM is full FR

## 2019-04-01 ENCOUNTER — Other Ambulatory Visit: Payer: Self-pay | Admitting: Family Medicine

## 2021-03-05 ENCOUNTER — Encounter (HOSPITAL_BASED_OUTPATIENT_CLINIC_OR_DEPARTMENT_OTHER): Payer: Self-pay

## 2021-03-05 ENCOUNTER — Emergency Department (HOSPITAL_BASED_OUTPATIENT_CLINIC_OR_DEPARTMENT_OTHER): Payer: BLUE CROSS/BLUE SHIELD

## 2021-03-05 ENCOUNTER — Emergency Department (HOSPITAL_BASED_OUTPATIENT_CLINIC_OR_DEPARTMENT_OTHER)
Admission: EM | Admit: 2021-03-05 | Discharge: 2021-03-06 | Disposition: A | Discharge status: Home / Self Care | Payer: BLUE CROSS/BLUE SHIELD | Attending: Emergency Medicine | Admitting: Emergency Medicine

## 2021-03-05 ENCOUNTER — Other Ambulatory Visit: Payer: Self-pay

## 2021-03-05 DIAGNOSIS — E860 Dehydration: Secondary | ICD-10-CM

## 2021-03-05 DIAGNOSIS — R7303 Prediabetes: Secondary | ICD-10-CM | POA: Diagnosis not present

## 2021-03-05 DIAGNOSIS — I10 Essential (primary) hypertension: Secondary | ICD-10-CM | POA: Diagnosis not present

## 2021-03-05 DIAGNOSIS — Z79899 Other long term (current) drug therapy: Secondary | ICD-10-CM | POA: Diagnosis not present

## 2021-03-05 DIAGNOSIS — Z7984 Long term (current) use of oral hypoglycemic drugs: Secondary | ICD-10-CM | POA: Diagnosis not present

## 2021-03-05 DIAGNOSIS — R42 Dizziness and giddiness: Secondary | ICD-10-CM | POA: Diagnosis present

## 2021-03-05 DIAGNOSIS — Z96651 Presence of right artificial knee joint: Secondary | ICD-10-CM | POA: Insufficient documentation

## 2021-03-05 DIAGNOSIS — E876 Hypokalemia: Secondary | ICD-10-CM | POA: Insufficient documentation

## 2021-03-05 LAB — URINALYSIS, ROUTINE W REFLEX MICROSCOPIC
Bilirubin Urine: NEGATIVE
Glucose, UA: NEGATIVE mg/dL
Hgb urine dipstick: NEGATIVE
Ketones, ur: NEGATIVE mg/dL
Leukocytes,Ua: NEGATIVE
Nitrite: NEGATIVE
Protein, ur: NEGATIVE mg/dL
Specific Gravity, Urine: 1.021 (ref 1.005–1.030)
pH: 6.5 (ref 5.0–8.0)

## 2021-03-05 LAB — BASIC METABOLIC PANEL
Anion gap: 10 (ref 5–15)
BUN: 18 mg/dL (ref 6–20)
CO2: 28 mmol/L (ref 22–32)
Calcium: 9.7 mg/dL (ref 8.9–10.3)
Chloride: 100 mmol/L (ref 98–111)
Creatinine, Ser: 1.23 mg/dL (ref 0.61–1.24)
GFR, Estimated: >60 mL/min (ref >60)
Glucose, Bld: 139 mg/dL — ABNORMAL HIGH (ref 70–99)
Potassium: 3.2 mmol/L — ABNORMAL LOW (ref 3.5–5.1)
Sodium: 138 mmol/L (ref 135–145)

## 2021-03-05 MED ORDER — LACTATED RINGERS IV BOLUS
1000.0000 mL | Freq: Once | INTRAVENOUS | Status: AC
  Administered 2021-03-05: 22:00:00 1000 mL via INTRAVENOUS

## 2021-03-05 NOTE — Discharge Instructions (Signed)
Take the potassium supplementation that your PCP has prescribed

## 2021-03-05 NOTE — ED Notes (Signed)
Pt gone to CT 

## 2021-03-05 NOTE — ED Triage Notes (Signed)
Pt presents to the ED with wife. States that he has been feeling bad for over a week. Symptoms include: dizziness, nausea, loss of appetite, and diaphoresis.  Pt states that it is dizziness like he is going to pass out. EKG obtained at time of triage and given to provider.

## 2021-03-07 NOTE — ED Provider Notes (Signed)
[facility] EMERGENCY DEPT Provider Note   CSN: [id number] Arrival date & time: 03/05/21  1922     History Chief Complaint  Patient presents with   Dizziness    [name] is a 55 y.o. male.   Dizziness Associated symptoms: nausea   Associated symptoms: no chest pain and no shortness of breath   Patient presents with generalized weakness.  Fatigue.  States he feels dizzy.  States he will try and get up and will fall over.  States he does not like the room is spinning.  Feels more worn out too.  He works as a Surveyor, minerals for [employer].  States he is able to do activities such as 2 weeks of survival training.  States he does works after that.  She has been eating supplements to try and keep his weight up but still has lost weight.  Has seen PCP for similar without clear cause found.  Blood work had been done.  Has nausea at times.  Loss of appetite.  Sometimes will feel sweaty time.  Has had for a week but has had episodes for months before this also.Has had some similar symptoms with hypokalemia in the past.    Past Medical History:  Diagnosis Date   Cervical spine degeneration    5-6 narrowing of cervical spine   Hyperlipidemia    Hypertension     Patient Active Problem List   Diagnosis Date Noted   Heart palpitations 04/09/2018   Chest discomfort 04/09/2018   Shortness of breath 04/09/2018   Abnormal EKG 04/09/2018   Elevated glucose 04/10/2016   Need for hepatitis C screening test 04/10/2016   Screening for prostate cancer 04/10/2016   Cervical spinal stenosis 06/11/2015   Spondylosis of cervical region without myelopathy or radiculopathy 06/11/2015   Dizzy 05/07/2015   Prediabetes 07/29/2014   BMI 31.0-31.9,adult 07/23/2014   Left lateral epicondylitis 11/24/2013   Left shoulder pain 11/24/2013   Hypokalemia 04/10/2013   Right knee pain 04/10/2013   Neck pain 10/17/2012   MUSCLE CRAMPS 07/05/2008   Hyperlipidemia 08/13/2006   Essential  hypertension 08/13/2006   GERD 08/13/2006   DEGENERATION, CERVICAL DISC 08/13/2006    Past Surgical History:  Procedure Laterality Date   COLONOSCOPY     KNEE ARTHROSCOPY     right       Family History  Problem Relation Age of Onset   Diabetes Mother    Hypertension Mother    Hyperlipidemia Mother    Diabetes Father    Hypertension Father    Hyperlipidemia Sister    Hypertension Brother    Diabetes Brother     Social History   Tobacco Use   Smoking status: Never   Smokeless tobacco: Never  Substance Use Topics   Alcohol use: No   Drug use: Yes    Types: Other-see comments, Marijuana    Comment: denies current use    Home Medications Prior to Admission medications   Medication Sig Start Date End Date Taking? Authorizing Provider  amLODipine (NORVASC) 5 MG tablet Take 1 tablet (5 mg total) by mouth daily. 05/11/18   Doristine Bosworth, MD  chlorthalidone (HYGROTON) 25 MG tablet Take 1 tablet (25 mg total) by mouth daily. 05/13/18   Doristine Bosworth, MD  guaiFENesin (MUCINEX) 600 MG 12 hr tablet Take by mouth 2 (two) times daily.    [provider]  meloxicam (MOBIC) 7.5 MG tablet Take 1 tablet (7.5 mg total) by mouth daily. Patient not taking:  Reported on 04/09/2018 09/11/17   Doristine Bosworth, MD  metFORMIN (GLUCOPHAGE) 500 MG tablet TAKE 1 TABLET EVERY MORNING WITH BREAKFAST 02/24/19   Collie Siad A, MD  nitroGLYCERIN (NITROSTAT) 0.4 MG SL tablet Place 1 tablet (0.4 mg total) under the tongue every 5 (five) minutes as needed for chest pain. 04/09/18   Kallie Locks, FNP  potassium chloride (KLOR-CON) 20 MEQ packet Take 20 mEq by mouth daily. 05/30/18   Doristine Bosworth, MD  potassium chloride SA (K-DUR,KLOR-CON) 20 MEQ tablet Take 2 tablets (40 mEq total) by mouth daily for 7 days. 04/11/18 04/18/18  Mesner, Barbara Cower, MD  potassium chloride SA (KLOR-CON M20) 20 MEQ tablet Take 1 tablet (20 mEq total) by mouth daily. 06/04/18   Doristine Bosworth, MD  rosuvastatin  (CRESTOR) 40 MG tablet TAKE 1 TABLET BY MOUTH ONCE DAILY 09/04/18   Collie Siad A, MD  tadalafil (CIALIS) 10 MG tablet Take 1 tablet (10 mg total) by mouth every other day as needed for erectile dysfunction. 05/30/18   Doristine Bosworth, MD    Allergies    Niacin  Review of Systems   Review of Systems  Constitutional:  Positive for unexpected weight change. Negative for appetite change.  Respiratory:  Negative for shortness of breath.   Cardiovascular:  Negative for chest pain.  Gastrointestinal:  Positive for nausea. Negative for abdominal pain.  Genitourinary:  Negative for flank pain.  Neurological:  Positive for dizziness.  Psychiatric/Behavioral:  Negative for confusion.    Physical Exam Updated Vital Signs BP 126/78 (BP Location: Left Arm)    Pulse 65    Temp 98.1 F (36.7 C) (Oral)    Resp 17    Ht 5\' 8"  (1.727 m)    Wt 83.9 kg    SpO2 100%    BMI 28.13 kg/m   Physical Exam Vitals and nursing note reviewed.  HENT:     Head: Atraumatic.  Eyes:     Pupils: Pupils are equal, round, and reactive to light.  Cardiovascular:     Rate and Rhythm: Normal rate.  Pulmonary:     Breath sounds: No wheezing.  Abdominal:     Tenderness: There is no abdominal tenderness.  Musculoskeletal:        General: No tenderness.  Skin:    Capillary Refill: Capillary refill takes less than 2 seconds.     Coloration: Skin is not jaundiced.  Neurological:     Mental Status: He is alert and oriented to person, place, and time.     Comments: Finger-nose intact.  Eye movements intact.  No nystagmus.  Heel-to-shin intact.    ED Results / Procedures / Treatments   Labs (all labs ordered are listed, but only abnormal results are displayed) Labs Reviewed  BASIC METABOLIC PANEL - Abnormal; Notable for the following components:      Result Value   Potassium 3.2 (*)    Glucose, Bld 139 (*)    All other components within normal limits  URINALYSIS, ROUTINE W REFLEX MICROSCOPIC    EKG EKG  Interpretation  Date/Time:  Wednesday March 05 2021 19:54:04 EST Ventricular Rate:  67 PR Interval:  154 QRS Duration: 92 QT Interval:  404 QTC Calculation: 426 R Axis:   82 Text Interpretation: Normal sinus rhythm Minimal voltage criteria for LVH, may be normal variant ( Sokolow-Lyon ) T wave abnormality, consider inferolateral ischemia Abnormal ECG When compared with ECG of 11-Apr-2018 15:01, No significant change was found Confirmed by 13-Apr-2018 (  [id number]) on 03/05/2021 10:42:53 PM  Radiology CT Head Wo Contrast  Result Date: 03/05/2021 CLINICAL DATA:  Nonspecific dizziness EXAM: CT HEAD WITHOUT CONTRAST TECHNIQUE: Contiguous axial images were obtained from the base of the skull through the vertex without intravenous contrast. COMPARISON:  06/23/2014 FINDINGS: Brain: No evidence of acute infarction, hemorrhage, hydrocephalus, extra-axial collection or mass lesion/mass effect. Vascular: No hyperdense vessel or unexpected calcification. Skull: Normal. Negative for fracture or focal lesion. Sinuses/Orbits: No acute finding. Other: None. IMPRESSION: No acute intracranial abnormalities. Electronically Signed   By: Burman Nieves M.D.   On: 03/05/2021 22:14   DG Chest Portable 1 View  Result Date: 03/05/2021 CLINICAL DATA:  Dizziness, nausea, loss of appetite, and diaphoresis for a week. EXAM: PORTABLE CHEST 1 VIEW COMPARISON:  04/11/2018 FINDINGS: The heart size and mediastinal contours are within normal limits. Both lungs are clear. The visualized skeletal structures are unremarkable. IMPRESSION: No active disease. Electronically Signed   By: Burman Nieves M.D.   On: 03/05/2021 22:19    Procedures Procedures   Medications Ordered in ED Medications  lactated ringers bolus 1,000 mL (0 mLs Intravenous Stopped 03/06/21 0012)    ED Course  I have reviewed the triage vital signs and the nursing notes.  Pertinent labs & imaging results that were available during my care of the  patient were reviewed by me and considered in my medical decision making (see chart for details).    MDM Rules/Calculators/A&P   .Patient with dizziness and weakness.  Has been going for a while.  Similar episode in the past hypokalemia.  Nonfocal exam but did feel little unsteady when he sat up.  No definite cerebellar signs.  Does have a hypokalemia on the labs.  Lactated Ringer bolus given and patient felt back at baseline.  Extensive work-up has been done between here and PCP.  Appears stable for discharge.  Doubt central cause such as stroke.  May be extra sensitive to hypokalemia since he has had episodes like this in the past.  Will discharge home.                          Final Clinical Impression(s) / ED Diagnoses Final diagnoses:  Dehydration  Hypokalemia    Rx / DC Orders ED Discharge Orders     None        Benjiman Core, MD 03/07/21 [phone]
